# Patient Record
Sex: Male | Born: 1960 | Race: White | Hispanic: No | Marital: Married | Smoking: Current every day smoker
Health system: Southern US, Community
[De-identification: ages and names within clinical notes are randomized; demographics above are authoritative.]

## PROBLEM LIST (undated history)

## (undated) DIAGNOSIS — J449 Chronic obstructive pulmonary disease, unspecified: Secondary | ICD-10-CM

## (undated) DIAGNOSIS — K219 Gastro-esophageal reflux disease without esophagitis: Secondary | ICD-10-CM

## (undated) DIAGNOSIS — E785 Hyperlipidemia, unspecified: Secondary | ICD-10-CM

## (undated) DIAGNOSIS — A630 Anogenital (venereal) warts: Secondary | ICD-10-CM

## (undated) HISTORY — PX: INGUINAL HERNIA REPAIR: SUR1180

---
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
## 1998-10-17 ENCOUNTER — Encounter: Payer: Self-pay | Admitting: Internal Medicine

## 1998-10-17 ENCOUNTER — Inpatient Hospital Stay (HOSPITAL_COMMUNITY): Admission: EM | Admit: 1998-10-17 | Discharge: 1998-10-18 | Payer: Self-pay | Admitting: Emergency Medicine

## 1998-10-18 ENCOUNTER — Encounter: Payer: Self-pay | Admitting: Internal Medicine

## 2005-03-15 ENCOUNTER — Emergency Department (HOSPITAL_COMMUNITY): Admission: EM | Admit: 2005-03-15 | Discharge: 2005-03-15 | Payer: Self-pay | Admitting: Family Medicine

## 2005-05-18 ENCOUNTER — Emergency Department (HOSPITAL_COMMUNITY): Admission: EM | Admit: 2005-05-18 | Discharge: 2005-05-18 | Payer: Self-pay | Admitting: *Deleted

## 2006-09-05 ENCOUNTER — Observation Stay (HOSPITAL_COMMUNITY): Admission: EM | Admit: 2006-09-05 | Discharge: 2006-09-05 | Payer: Self-pay | Admitting: Emergency Medicine

## 2010-01-08 ENCOUNTER — Encounter: Admission: RE | Admit: 2010-01-08 | Discharge: 2010-01-08 | Payer: Self-pay | Admitting: Family Medicine

## 2010-07-16 NOTE — Consult Note (Signed)
NAMEJAYDEE, Wesley [name] NO.:  [id number]   MEDICAL RECORD NO.:  [id number]          PATIENT TYPE:  INP   LOCATION:  2911                         FACILITY:  [facility]   PHYSICIAN:  Wesley [name], M.D. DATE OF BIRTH:  Sep 13, 1960   DATE OF CONSULTATION:  09/05/2006  DATE OF DISCHARGE:                                 CONSULTATION   HISTORY:  This is a 50 year old Caucasian gentleman who was admitted  early in the morning on 09/05/2006 with chest pain.  He does not have  any history of known prior coronary artery disease.  He does have risk  factors of hyperlipidemia and tobacco abuse.  He had had some  intermittent chest discomfort down his arms associated with tightness  which was associated with some diaphoresis on the night of admission.  There was some radiation to the left arm.   The patient has a past history of elevated cholesterol.  He is on statin  therapy and is followed by Dr. Vianne [name].  He is on Zocor.  Last  evening he was on a July 4 party.  He drank about 5 beers last evening  prior to coming to the emergency room and his blood alcohol level on  arrival was significantly elevated at 148.  His D-dimer was normal at  0.22.  Chest x-ray was normal.  Troponin levels were normal x2, and CK  MBs were normal x2.  His electrocardiograms have been normal.   SOCIAL HISTORY:  Reveals that he is single but has a steady girlfriend.  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  He smokes a pack of cigarettes a day.   FAMILY HISTORY:  Reveals that his father has had valve surgery and his  mother is living and has no heart problems.  Several of his paternal  uncles have had coronary disease.   ALLERGIES:  The patient has no known drug allergies.   REVIEW OF SYSTEMS:  His review of systems is otherwise negative.   PHYSICAL EXAMINATION:  On exam his weight is 205.  The blood pressure is  118/62, pulse is 80.  Respirations normal, O2  sat 93%.  A well-  nourished, well-developed gentleman in no distress.  The skin is clear.  HEENT:  Negative.  LUNGS are clear to percussion and auscultation.  HEART:  Reveals no murmur, gallop, rub or click.  ABDOMEN is soft and nontender.  EXTREMITIES:  Show no phlebitis or edema.   EKG is normal.  He was at sinus bradycardia at 57 and no ischemic  changes.  Cardiac enzymes are negative.   IMPRESSION:  1. Atypical chest pain, but several risk factors for coronary disease,      myocardial infarction ruled out  2. Excessive alcohol ingestion last evening.  3. Tobacco abuse.   DISPOSITION:  At this point I believe that the patient can be safely  discharged home and we will plan to do a follow-up treadmill Cardiolite  stress test on him in the office next week.  He will call our office  Monday  to set that up with my nurse.  Instructions have been given.   DISCHARGE MEDICATIONS:  1. Ecotrin 325 mg daily  2. Zocor 20 mg daily  3. Protonix 40 mg daily  4. Toprol XL 25 mg once a day.  5. He will be given Nitrostat 150 units for p.r.n. use until we can do      his stress test           ______________________________  Wesley [name], M.D.     TB/MEDQ  D:  09/05/2006  T:  09/06/2006  Job:  [id number]   cc:   Wesley L. Ladona Ridgel, MD

## 2010-07-16 NOTE — H&P (Signed)
Wesley [name], Wesley [name]                 ACCOUNT NO.:  [id number]   MEDICAL RECORD NO.:  [id number]          PATIENT TYPE:  INP   LOCATION:  1828                         FACILITY:  [facility]   PHYSICIAN:  Melissa L. Ladona Ridgel, MD  DATE OF BIRTH:  07-04-1960   DATE OF ADMISSION:  09/04/2006  DATE OF DISCHARGE:                              HISTORY & PHYSICAL   CHIEF COMPLAINT:  Chest pain.   PRIMARY CARE PHYSICIAN:  Dr. Vianne Bulls   HISTORY OF PRESENT ILLNESS:  The patient is a 50 year old white male old white male  with a past medical history of hypercholesterolemia who states that he  started to have intermittent chest pain for the last two to three weeks.  Describes this as a tightness and really minimalizes the pain portion of  this.  He states that it is continuous, sometimes up and down his arm.  He says he had this tonight with profuse onset of sweating.  He is just  generally not feeling well.  He states that nothing makes it better or  worse, and the location varies.  There is some element of radiation down  the arm.  He denies any diaphoresis, shortness of breath, nausea or  vomiting really with this illness.   REVIEW OF SYSTEMS:  No appetite changes.  No weight loss or weight gain.  No muscle aches, genitourinary symptoms, or other GI symptoms.  All else  are negative.   PAST MEDICAL HISTORY:  As stated, hypercholesterolemia.   PAST SURGICAL HISTORY:  None.   SOCIAL HISTORY:  He smokes a pack a day for the last 22 years.  He  drinks a couple of wine glasses a night.  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  The patient has multiple family members who he has  requested to be at the bedside and, therefore, I have not questioned him  regarding illicit drug use.   FAMILY HISTORY:  Dad is living, with a history of coronary artery  disease at the age of 20.  Mom is living with no medical illness, and he  has three sisters who are healthy.   ALLERGIES:  None.   MEDICATIONS:  Zocor questionably  20 mg a day, and a multivitamin.   PHYSICAL EXAMINATION:  Vital Signs:  Temperature is 98.7, blood pressure  118/62, pulse 87, respirations 18, saturation 93%.  Generally, this is a  well-developed, well-nourished white male in no acute distress.  He is  cognitively slow but appropriate.  He is normocephalic, atraumatic.  Pupils were equal, round, and reactive to light.  Extraocular movements  intact.  Mucous membranes are dry and neck is supple.  There is no JVD,  no carotid bruits.  Chest is decreased but clear.  Cardiovascular is  regular rate and rhythm.  Positive S1, S2.  No S3, S4.  No murmurs,  rubs, or gallops are noted.  Abdomen is soft, nontender, nondistended  with positive bowel sounds.  Extremities show no clubbing, cyanosis, or  edema.  Neurologically, he is mildly cognitively impaired with 5/5.  DTRs are 2+.  Toes are downgoing.  LABORATORY DATA AND RADIOLOGY:  Pertinent laboratories reveal cardiac  markers negative times two in the Emergency Room.  D-dimer is less than  22.  Sodium is 140, potassium 3.8, chloride 110, CO2 is 22, BUN is 15,  creatinine 0.8, and glucose is 107.  His white count is 5.6, hemoglobin  is 14.6, hematocrit 42.1 with platelets of 262.  His monocytes are  elevated but no neutrophilia.   Chest x-ray is negative.   ASSESSMENT AND PLAN:  The patient is a 50 year white male old white male with  atypical chest pain relieved in the Emergency Room per the staff with  nitroglycerin, although the patient states no exacerbating or  alleviating factors.  His risk factors for coronary artery disease are  hyperlipidemia, tobacco abuse, and a father with early coronary artery  disease at the age of 65.   (1) Cardiovascular:  Atypical chest pain with a history of  hyperlipidemia and tobacco abuse.  Will admit him to the Step-Down Unit  at this time.  I agree with the nitroglycerin and heparin as tolerated.  Will do serial cardiac markers.  I will check a urine drug  screen and  ethanol level.  Will recheck an EKG in the a.m.  He will be continued on  aspirin and, if tolerated with his blood pressure and heart rate, will  give a low-dose beta-blocker.  Will ask for Cardiac evaluation in the  a.m.  (2) Pulmonary:  He has no dyspnea.  His d-dimer is within normal  limits.  I do not believe there is a need for CT scanning of the chest  at this time.  We will encourage tobacco cessation.  (3) GI:  Will start  a month of Protonix.  (4) GU:  Will check urine drug screen.  (5)  Endocrine:  Will check a TSH.  (6) DVT prophylaxis will be with heparin.      Melissa L. Ladona Ridgel, MD  Electronically Signed     MLT/MEDQ  D:  09/05/2006  T:  09/05/2006  Job:  [id number]   cc:   C. Duane Lope, M.D.

## 2010-07-19 NOTE — Discharge Summary (Signed)
NAMESOPHIE, [name]                 ACCOUNT NO.:  [id number]   MEDICAL RECORD NO.:  [id number]          PATIENT TYPE:  INP   LOCATION:  2911                         FACILITY:  [facility]   PHYSICIAN:  Barnetta Chapel, MDDATE OF BIRTH:  10-04-1960   DATE OF ADMISSION:  09/04/2006  DATE OF DISCHARGE:  09/05/2006                               DISCHARGE SUMMARY   PRIMARY CARE PHYSICIAN:  C. Duane Lope, M.D.   DISCHARGE DIAGNOSES:  1. Chest pain, atypical.  2. Tobacco use disorder.   CONSULTATIONS:  Cardiology consult.   DISCHARGE MEDICATIONS:  1. Zocor 20 mg p.o. once daily.  2. Multivitamin.  3. Protonix 40 mg t.i.d.   BRIEF HISTORY/HOSPITAL COURSE:  Please refer to the H&P done earlier on  in the day (September 04, 2006).  Patient is a 50 year old male with a past  medical history significant for dyslipidemia.  The patient presented  with chest pain that has been going on for three weeks.   The patient was admitted to the telemetry floor for observation.  The  cardiac enzymes were all negative.  The chest pain is thought to be  atypical.  A cardiology consult was called.  The patient was seen by the  cardiologist, and an outpatient cardiac stress test was planned.  Cardiology also advised Toprol, some aspirin, and nitro.   DISCHARGE PLAN:  1. Discharge the patient home today.  2. Follow up with the cardiologist for outpatient cardiac stress test.  3. Cardiac diet.  4. Activity as tolerated.      Barnetta Chapel, MD  Electronically Signed     SIO/MEDQ  D:  11/03/2006  T:  11/03/2006  Job:  [id number]   cc:   C. Duane Lope, M.D.

## 2010-12-17 LAB — CBC
HCT: 41.3
HCT: 42.1
Hemoglobin: 14.2
Hemoglobin: 14.6
MCHC: 34.5
MCHC: 34.8
MCV: 91.3
MCV: 93.3
Platelets: 257
Platelets: 262
RBC: 4.43
RBC: 4.61
RDW: 12.2
RDW: 12.3
WBC: 11.2 — ABNORMAL HIGH
WBC: 11.6 — ABNORMAL HIGH

## 2010-12-17 LAB — I-STAT 8, (EC8 V) (CONVERTED LAB)
Acid-base deficit: 4 — ABNORMAL HIGH
BUN: 15
Bicarbonate: 20.7
Chloride: 110
Glucose, Bld: 107 — ABNORMAL HIGH
HCT: 44
Hemoglobin: 15
Operator id: 189501
Potassium: 3.8
Sodium: 140
TCO2: 22
pCO2, Ven: 34.8 — ABNORMAL LOW
pH, Ven: 7.383 — ABNORMAL HIGH

## 2010-12-17 LAB — CK TOTAL AND CKMB (NOT AT ARMC)
CK, MB: 2.4
Relative Index: 1.2
Total CK: 193

## 2010-12-17 LAB — D-DIMER, QUANTITATIVE (NOT AT ARMC): D-Dimer, Quant: <0.22

## 2010-12-17 LAB — POCT CARDIAC MARKERS
CKMB, poc: 1.4
CKMB, poc: 1.6
Myoglobin, poc: 72.4
Myoglobin, poc: 85.8
Operator id: 189501
Operator id: 189501
Troponin i, poc: <0.05
Troponin i, poc: <0.05

## 2010-12-17 LAB — RAPID URINE DRUG SCREEN, HOSP PERFORMED
Amphetamines: NOT DETECTED
Barbiturates: NOT DETECTED
Benzodiazepines: NOT DETECTED
Cocaine: NOT DETECTED
Opiates: NOT DETECTED
Tetrahydrocannabinol: NOT DETECTED

## 2010-12-17 LAB — BASIC METABOLIC PANEL
BUN: 13
CO2: 22
Calcium: 8.3 — ABNORMAL LOW
Chloride: 107
Creatinine, Ser: 0.83
GFR calc Af Amer: >60
GFR calc non Af Amer: >60
Glucose, Bld: 95
Potassium: 3.9
Sodium: 140

## 2010-12-17 LAB — DIFFERENTIAL
Basophils Absolute: 0.1
Basophils Relative: 1
Eosinophils Absolute: 0.3
Eosinophils Relative: 3
Lymphocytes Relative: 38
Lymphs Abs: 4.4 — ABNORMAL HIGH
Monocytes Absolute: 0.8 — ABNORMAL HIGH
Monocytes Relative: 7
Neutro Abs: 5.9
Neutrophils Relative %: 51

## 2010-12-17 LAB — CARDIAC PANEL(CRET KIN+CKTOT+MB+TROPI)
CK, MB: 2
Relative Index: 1.3
Total CK: 155
Troponin I: 0.01

## 2010-12-17 LAB — POCT I-STAT CREATININE
Creatinine, Ser: 0.8
Operator id: 189501

## 2010-12-17 LAB — TSH: TSH: 2.433

## 2010-12-17 LAB — TROPONIN I: Troponin I: 0.01

## 2010-12-17 LAB — ETHANOL: Alcohol, Ethyl (B): 148 — ABNORMAL HIGH

## 2010-12-17 LAB — HEPARIN LEVEL (UNFRACTIONATED): Heparin Unfractionated: 0.66

## 2011-06-06 ENCOUNTER — Encounter (INDEPENDENT_AMBULATORY_CARE_PROVIDER_SITE_OTHER): Payer: Self-pay

## 2011-06-16 ENCOUNTER — Telehealth (INDEPENDENT_AMBULATORY_CARE_PROVIDER_SITE_OTHER): Payer: Self-pay | Admitting: Surgery

## 2011-06-16 ENCOUNTER — Encounter (INDEPENDENT_AMBULATORY_CARE_PROVIDER_SITE_OTHER): Payer: Self-pay | Admitting: Surgery

## 2011-06-16 ENCOUNTER — Ambulatory Visit (INDEPENDENT_AMBULATORY_CARE_PROVIDER_SITE_OTHER): Payer: BC Managed Care – PPO | Admitting: Surgery

## 2011-06-16 VITALS — BP 148/88 | HR 82 | Temp 98.2°F | Ht 68.0 in | Wt 199.4 lb

## 2011-06-16 DIAGNOSIS — A630 Anogenital (venereal) warts: Secondary | ICD-10-CM

## 2011-06-16 NOTE — Patient Instructions (Signed)
Genital Warts Genital warts are a sexually transmitted infection. They may appear as small bumps on the tissues of the genital area. CAUSES  Genital warts are caused by a virus called human papillomavirus (HPV). HPV is the most common sexually transmitted disease (STD) and infection of the sex organs. This infection is spread by having unprotected sex with an infected person. It can be spread by vaginal, anal, and oral sex. Many people do not know they are infected. They may be infected for years without problems. However, even if they do not have problems, they can unknowingly pass the infection to their sexual partners. SYMPTOMS   Itching and irritation in the genital area.   Warts that bleed.   Painful sexual intercourse.  DIAGNOSIS  Warts are usually recognized with the naked eye on the vagina, vulva, perineum, anus, and rectum. Certain tests can also diagnose genital warts, such as:  A Pap test.   A tissue sample (biopsy) exam.   Colposcopy. A magnifying tool is used to examine the vagina and cervix. The HPV cells will change color when certain solutions are used.  TREATMENT  Warts can be removed by:  Applying certain chemicals, such as cantharidin or podophyllin.   Liquid nitrogen freezing (cryotherapy).   Immunotherapy with candida or trichophyton injections.   Laser treatment.   Burning with an electrified probe (electrocautery).   Interferon injections.   Surgery.  PREVENTION  HPV vaccination can help prevent HPV infections that cause genital warts and that cause cancer of the cervix. It is recommended that the vaccination be given to people between the ages 9 to 26 years old. The vaccine might not work as well or might not work at all if you already have HPV. It should not be given to pregnant women. HOME CARE INSTRUCTIONS   It is important to follow your caregiver's instructions. The warts will not go away without treatment. Repeat treatments are often needed to get  rid of warts. Even after it appears that the warts are gone, the normal tissue underneath often remains infected.   Do not try to treat genital warts with medicine used to treat hand warts. This type of medicine is strong and can burn the skin in the genital area, causing more damage.   Tell your past and current sexual partner(s) that you have genital warts. They may be infected also and need treatment.   Avoid sexual contact while being treated.   Do not touch or scratch the warts. The infection may spread to other parts of your body.   Women with genital warts should have a cervical cancer check (Pap test) at least once a year. This type of cancer is slow-growing and can be cured if found early. Chances of developing cervical cancer are increased with HPV.   Inform your obstetrician about your warts in the event of pregnancy. This virus can be passed to the baby's respiratory tract. Discuss this with your caregiver.   Use a condom during sexual intercourse. Following treatment, the use of condoms will help prevent reinfection.   Ask your caregiver about using over-the-counter anti-itch creams.  SEEK MEDICAL CARE IF:   Your treated skin becomes red, swollen, or painful.   You have a fever.   You feel generally ill.   You feel little lumps in and around your genital area.   You are bleeding or have painful sexual intercourse.  MAKE SURE YOU:   Understand these instructions.   Will watch your condition.   Will   get help right away if you are not doing well or get worse.  Document Released: 02/15/2000 Document Revised: 02/06/2011 Document Reviewed: 08/26/2010 [employer] Patient Information 2012 [employer]. 
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
## 2011-06-17 NOTE — Telephone Encounter (Signed)
IN OFFICE ALL DAY ON [address]  THIS NEEDS TO BE SENT TO SCHEDULERS TO DISCUSS WITH PATIENT TIME AND SETTING SOMETHING UP.

## 2011-06-19 ENCOUNTER — Other Ambulatory Visit (INDEPENDENT_AMBULATORY_CARE_PROVIDER_SITE_OTHER): Payer: Self-pay | Admitting: Surgery

## 2011-07-02 DIAGNOSIS — A630 Anogenital (venereal) warts: Secondary | ICD-10-CM

## 2011-07-02 HISTORY — DX: Anogenital (venereal) warts: A63.0

## 2011-07-11 ENCOUNTER — Encounter (HOSPITAL_BASED_OUTPATIENT_CLINIC_OR_DEPARTMENT_OTHER): Payer: Self-pay | Admitting: *Deleted

## 2011-07-11 NOTE — Pre-Procedure Instructions (Signed)
To come for CMET, CBC, diff, CXR

## 2011-07-15 ENCOUNTER — Encounter (HOSPITAL_BASED_OUTPATIENT_CLINIC_OR_DEPARTMENT_OTHER)
Admission: RE | Admit: 2011-07-15 | Discharge: 2011-07-15 | Disposition: A | Discharge status: Home / Self Care | Payer: BC Managed Care – PPO | Source: Ambulatory Visit | Attending: Surgery | Admitting: Surgery

## 2011-07-15 ENCOUNTER — Ambulatory Visit
Admission: RE | Admit: 2011-07-15 | Discharge: 2011-07-15 | Disposition: A | Discharge status: Home / Self Care | Payer: BC Managed Care – PPO | Source: Ambulatory Visit | Attending: Surgery | Admitting: Surgery

## 2011-07-15 LAB — DIFFERENTIAL
Basophils Absolute: 0.1 10*3/uL (ref 0.0–0.1)
Basophils Relative: 1 % (ref 0–1)
Eosinophils Absolute: 0.2 10*3/uL (ref 0.0–0.7)
Eosinophils Relative: 3 % (ref 0–5)
Lymphocytes Relative: 30 % (ref 12–46)
Lymphs Abs: 2.4 10*3/uL (ref 0.7–4.0)
Monocytes Absolute: 0.8 10*3/uL (ref 0.1–1.0)
Monocytes Relative: 9 % (ref 3–12)
Neutro Abs: 4.8 10*3/uL (ref 1.7–7.7)
Neutrophils Relative %: 58 % (ref 43–77)

## 2011-07-15 LAB — COMPREHENSIVE METABOLIC PANEL
ALT: 36 U/L (ref 0–53)
AST: 30 U/L (ref 0–37)
Albumin: 4 g/dL (ref 3.5–5.2)
Alkaline Phosphatase: 97 U/L (ref 39–117)
BUN: 13 mg/dL (ref 6–23)
CO2: 24 mEq/L (ref 19–32)
Calcium: 9.3 mg/dL (ref 8.4–10.5)
Chloride: 103 mEq/L (ref 96–112)
Creatinine, Ser: 0.78 mg/dL (ref 0.50–1.35)
GFR calc Af Amer: >90 mL/min (ref >90)
GFR calc non Af Amer: >90 mL/min (ref >90)
Glucose, Bld: 92 mg/dL (ref 70–99)
Potassium: 4.9 mEq/L (ref 3.5–5.1)
Sodium: 138 mEq/L (ref 135–145)
Total Bilirubin: 0.8 mg/dL (ref 0.3–1.2)
Total Protein: 7.3 g/dL (ref 6.0–8.3)

## 2011-07-15 LAB — CBC
HCT: 43.8 % (ref 39.0–52.0)
Hemoglobin: 15.9 g/dL (ref 13.0–17.0)
MCH: 32.5 pg (ref 26.0–34.0)
MCHC: 36.3 g/dL — ABNORMAL HIGH (ref 30.0–36.0)
MCV: 89.6 fL (ref 78.0–100.0)
Platelets: 238 10*3/uL (ref 150–400)
RBC: 4.89 MIL/uL (ref 4.22–5.81)
RDW: 12.1 % (ref 11.5–15.5)
WBC: 8.2 10*3/uL (ref 4.0–10.5)

## 2011-07-16 ENCOUNTER — Encounter (HOSPITAL_BASED_OUTPATIENT_CLINIC_OR_DEPARTMENT_OTHER): Payer: Self-pay | Admitting: *Deleted

## 2011-07-16 ENCOUNTER — Ambulatory Visit (HOSPITAL_BASED_OUTPATIENT_CLINIC_OR_DEPARTMENT_OTHER)
Admission: RE | Admit: 2011-07-16 | Discharge: 2011-07-16 | Disposition: A | Discharge status: Home / Self Care | Payer: BC Managed Care – PPO | Source: Ambulatory Visit | Attending: Surgery | Admitting: Surgery

## 2011-07-16 ENCOUNTER — Encounter (HOSPITAL_BASED_OUTPATIENT_CLINIC_OR_DEPARTMENT_OTHER)
Admission: RE | Disposition: A | Discharge status: Home / Self Care | Payer: Self-pay | Source: Ambulatory Visit | Attending: Surgery

## 2011-07-16 ENCOUNTER — Encounter (HOSPITAL_BASED_OUTPATIENT_CLINIC_OR_DEPARTMENT_OTHER): Payer: Self-pay | Admitting: Certified Registered Nurse Anesthetist

## 2011-07-16 ENCOUNTER — Ambulatory Visit (HOSPITAL_BASED_OUTPATIENT_CLINIC_OR_DEPARTMENT_OTHER): Payer: BC Managed Care – PPO | Admitting: Certified Registered Nurse Anesthetist

## 2011-07-16 DIAGNOSIS — F17200 Nicotine dependence, unspecified, uncomplicated: Secondary | ICD-10-CM | POA: Insufficient documentation

## 2011-07-16 DIAGNOSIS — A630 Anogenital (venereal) warts: Secondary | ICD-10-CM

## 2011-07-16 DIAGNOSIS — Z9889 Other specified postprocedural states: Secondary | ICD-10-CM

## 2011-07-16 DIAGNOSIS — K219 Gastro-esophageal reflux disease without esophagitis: Secondary | ICD-10-CM | POA: Insufficient documentation

## 2011-07-16 DIAGNOSIS — R51 Headache: Secondary | ICD-10-CM | POA: Insufficient documentation

## 2011-07-16 HISTORY — PX: EXAMINATION UNDER ANESTHESIA: SHX1540

## 2011-07-16 HISTORY — DX: Gastro-esophageal reflux disease without esophagitis: K21.9

## 2011-07-16 HISTORY — PX: WART FULGURATION: SHX5245

## 2011-07-16 HISTORY — DX: Anogenital (venereal) warts: A63.0

## 2011-07-16 SURGERY — FULGURATION, WART, ANUS
Anesthesia: General | Site: Anus | Wound class: Clean Contaminated

## 2011-07-16 MED ORDER — FLEET ENEMA 7-19 GM/118ML RE ENEM: 1.0000 | ENEMA | Freq: Once | RECTAL | Status: DC

## 2011-07-16 MED ORDER — PROPOFOL 10 MG/ML IV EMUL
INTRAVENOUS | Status: DC | PRN
  Administered 2011-07-16: 150 mg via INTRAVENOUS

## 2011-07-16 MED ORDER — DROPERIDOL 2.5 MG/ML IJ SOLN
INTRAMUSCULAR | Status: DC | PRN
  Administered 2011-07-16: 0.625 mg via INTRAVENOUS

## 2011-07-16 MED ORDER — CEFAZOLIN SODIUM-DEXTROSE 2-3 GM-% IV SOLR: 2.0000 g | INTRAVENOUS | Status: DC

## 2011-07-16 MED ORDER — LACTATED RINGERS IV SOLN
INTRAVENOUS | Status: DC
  Administered 2011-07-16 (×2): via INTRAVENOUS

## 2011-07-16 MED ORDER — DEXAMETHASONE SODIUM PHOSPHATE 4 MG/ML IJ SOLN
INTRAMUSCULAR | Status: DC | PRN
  Administered 2011-07-16: 10 mg via INTRAVENOUS

## 2011-07-16 MED ORDER — HYDROMORPHONE HCL PF 1 MG/ML IJ SOLN: 0.2500 mg | INTRAMUSCULAR | Status: DC | PRN

## 2011-07-16 MED ORDER — MIDAZOLAM HCL 5 MG/5ML IJ SOLN
INTRAMUSCULAR | Status: DC | PRN
  Administered 2011-07-16: 2 mg via INTRAVENOUS

## 2011-07-16 MED ORDER — LIDOCAINE HCL (CARDIAC) 20 MG/ML IV SOLN
INTRAVENOUS | Status: DC | PRN
  Administered 2011-07-16: 60 mg via INTRAVENOUS

## 2011-07-16 MED ORDER — ONDANSETRON HCL 4 MG/2ML IJ SOLN
INTRAMUSCULAR | Status: DC | PRN
  Administered 2011-07-16: 4 mg via INTRAVENOUS

## 2011-07-16 MED ORDER — CEFAZOLIN SODIUM-DEXTROSE 2-3 GM-% IV SOLR
2.0000 g | INTRAVENOUS | Status: AC
  Administered 2011-07-16: 2 g via INTRAVENOUS

## 2011-07-16 MED ORDER — MIDAZOLAM HCL 2 MG/2ML IJ SOLN: 0.5000 mg | INTRAMUSCULAR | Status: DC | PRN

## 2011-07-16 MED ORDER — ACETAMINOPHEN 10 MG/ML IV SOLN
1000.0000 mg | Freq: Once | INTRAVENOUS | Status: AC
  Administered 2011-07-16: 1000 mg via INTRAVENOUS

## 2011-07-16 MED ORDER — METOCLOPRAMIDE HCL 5 MG/ML IJ SOLN: 10.0000 mg | Freq: Once | INTRAMUSCULAR | Status: DC | PRN

## 2011-07-16 MED ORDER — BUPIVACAINE-EPINEPHRINE 0.25% -1:200000 IJ SOLN
INTRAMUSCULAR | Status: DC | PRN
  Administered 2011-07-16: 30 mL

## 2011-07-16 MED ORDER — DIBUCAINE 1 % RE OINT
TOPICAL_OINTMENT | RECTAL | Status: DC | PRN
  Administered 2011-07-16: 1046 via RECTAL

## 2011-07-16 MED ORDER — FENTANYL CITRATE 0.05 MG/ML IJ SOLN
INTRAMUSCULAR | Status: DC | PRN
  Administered 2011-07-16: 50 ug via INTRAVENOUS

## 2011-07-16 SURGICAL SUPPLY — 38 items
BLADE SURG 15 STRL LF DISP TIS (BLADE) ×1 IMPLANT
BLADE SURG 15 STRL SS (BLADE) ×2
BRIEF STRETCH FOR OB PAD LRG (UNDERPADS AND DIAPERS) IMPLANT
CANISTER SUCTION 1200CC (MISCELLANEOUS) ×2 IMPLANT
CLOTH BEACON ORANGE TIMEOUT ST (SAFETY) ×2 IMPLANT
COVER MAYO STAND STRL (DRAPES) ×1 IMPLANT
DECANTER SPIKE VIAL GLASS SM (MISCELLANEOUS) ×2 IMPLANT
DRAPE UTILITY XL STRL (DRAPES) ×2 IMPLANT
DRSG PAD ABDOMINAL 8X10 ST (GAUZE/BANDAGES/DRESSINGS) ×2 IMPLANT
ELECT REM PT RETURN 9FT ADLT (ELECTROSURGICAL) ×2
ELECTRODE REM PT RTRN 9FT ADLT (ELECTROSURGICAL) IMPLANT
FILTER 7/8 IN (FILTER) ×1 IMPLANT
GAUZE SPONGE 4X4 12PLY STRL LF (GAUZE/BANDAGES/DRESSINGS) ×2 IMPLANT
GLOVE BIO SURGEON STRL SZ 6.5 (GLOVE) ×1 IMPLANT
GLOVE BIOGEL PI IND STRL 8 (GLOVE) ×1 IMPLANT
GLOVE BIOGEL PI INDICATOR 8 (GLOVE) ×1
GLOVE ECLIPSE 8.0 STRL XLNG CF (GLOVE) ×2 IMPLANT
GLOVE INDICATOR 7.0 STRL GRN (GLOVE) ×1 IMPLANT
GOWN PREVENTION PLUS XLARGE (GOWN DISPOSABLE) ×2 IMPLANT
NDL HYPO 25X1 1.5 SAFETY (NEEDLE) ×1 IMPLANT
NEEDLE HYPO 25X1 1.5 SAFETY (NEEDLE) ×2 IMPLANT
PACK BASIN DAY SURGERY FS (CUSTOM PROCEDURE TRAY) ×2 IMPLANT
PACK LITHOTOMY IV (CUSTOM PROCEDURE TRAY) ×2 IMPLANT
PENCIL BUTTON HOLSTER BLD 10FT (ELECTRODE) ×1 IMPLANT
SHEET MEDIUM DRAPE 40X70 STRL (DRAPES) IMPLANT
SPONGE SURGIFOAM ABS GEL 12-7 (HEMOSTASIS) IMPLANT
SURGILUBE 2OZ TUBE FLIPTOP (MISCELLANEOUS) ×2 IMPLANT
SUT CHROMIC 3 0 SH 27 (SUTURE) IMPLANT
SYR CONTROL 10ML LL (SYRINGE) ×2 IMPLANT
TOWEL OR 17X24 6PK STRL BLUE (TOWEL DISPOSABLE) ×3 IMPLANT
TOWEL OR NON WOVEN STRL DISP B (DISPOSABLE) ×2 IMPLANT
TRAY DSU PREP LF (CUSTOM PROCEDURE TRAY) ×2 IMPLANT
TRAY PROCTOSCOPIC FIBER OPTIC (SET/KITS/TRAYS/PACK) IMPLANT
TUBE CONNECTING 20X1/4 (TUBING) ×2 IMPLANT
TUBING STERILE (MISCELLANEOUS) ×1 IMPLANT
UNDERPAD 30X30 INCONTINENT (UNDERPADS AND DIAPERS) ×2 IMPLANT
WATER STERILE IRR 1000ML POUR (IV SOLUTION) IMPLANT
YANKAUER SUCT BULB TIP NO VENT (SUCTIONS) ×2 IMPLANT

## 2011-07-16 NOTE — Transfer of Care (Signed)
Immediate Anesthesia Transfer of Care Note  Patient: Wesley [name].  Procedure(s) Performed: Procedure(s) (LRB): FULGURATION ANAL WART (N/A) EXAM UNDER ANESTHESIA (N/A)  Patient Location: PACU  Anesthesia Type: General  Level of Consciousness: awake, alert , oriented and patient cooperative  Airway & Oxygen Therapy: Patient Spontanous Breathing and Patient connected to face mask oxygen  Post-op Assessment: Report given to PACU RN and Post -op Vital signs reviewed and stable  Post vital signs: Reviewed and stable  Complications: No apparent anesthesia complications
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
## 2011-07-16 NOTE — Anesthesia Preprocedure Evaluation (Signed)
Anesthesia Evaluation  Patient identified by MRN, date of birth, ID band Patient awake    Reviewed: Allergy & Precautions, H&P , NPO status , Patient's Chart, lab work & pertinent test results, reviewed documented beta blocker date and time   Airway Mallampati: II TM Distance: >3 FB Neck ROM: full    Dental   Pulmonary Current Smoker,          Cardiovascular negative cardio ROS      Neuro/Psych  Headaches, negative psych ROS   GI/Hepatic negative GI ROS, Neg liver ROS, GERD-  Medicated and Controlled,  Endo/Other  negative endocrine ROS  Renal/GU negative Renal ROS  negative genitourinary   Musculoskeletal   Abdominal   Peds  Hematology negative hematology ROS (+)   Anesthesia Other Findings See surgeon's H&P   Reproductive/Obstetrics negative OB ROS                           Anesthesia Physical Anesthesia Plan  ASA: II  Anesthesia Plan: General   Post-op Pain Management:    Induction: Intravenous  Airway Management Planned: LMA  Additional Equipment:   Intra-op Plan:   Post-operative Plan: Extubation in OR  Informed Consent: I have reviewed the patients History and Physical, chart, labs and discussed the procedure including the risks, benefits and alternatives for the proposed anesthesia with the patient or authorized representative who has indicated his/her understanding and acceptance.   Dental Advisory Given  Plan Discussed with: CRNA and Surgeon  Anesthesia Plan Comments:         Anesthesia Quick Evaluation

## 2011-07-16 NOTE — Discharge Instructions (Signed)
[facility] _______[facility] ° °RECTAL SURGERY POST OP INSTRUCTIONS: POST OP INSTRUCTIONS ° °Always review your discharge instruction sheet given to you by the facility where your surgery was performed. °IF YOU HAVE DISABILITY OR FAMILY LEAVE FORMS, YOU MUST BRING THEM TO THE OFFICE FOR PROCESSING.   °DO NOT GIVE THEM TO YOUR DOCTOR. ° °1. A  prescription for pain medication may be given to you upon discharge.  Take your pain medication as prescribed, if needed.  If narcotic pain medicine is not needed, then you may take acetaminophen (Tylenol) or ibuprofen (Advil) as needed. °2. Take your usually prescribed medications unless otherwise directed. °3. If you need a refill on your pain medication, please contact your pharmacy.  They will contact our office to request authorization. Prescriptions will not be filled after 5 pm or on week-ends. °4. You should follow a light diet the first 48 hours after arrival home, such as soup and crackers, etc.  Be sure to include lots of fluids daily.  Resume your normal diet 2-3 days after surgery.. °5. Most patients will experience some swelling and discomfort in the rectal area. Ice packs, reclining and warm tub soaks will help.  Swelling and discomfort can take several days to resolve.  °6. It is common to experience some constipation if taking pain medication after surgery.  Increasing fluid intake and taking a stool softener (such as Colace) will usually help or prevent this problem from occurring.  A mild laxative (Milk of Magnesia or Miralax) should be taken according to package directions if there are no bowel movements after 48 hours. °7. Unless discharge instructions indicate otherwise, leave your bandage dry and in place for 24 hours, or remove the bandage if you have a bowel movement. You may notice a small amount of bleeding with bowel movements for the first few days. You may have some packing in the rectum which will come out over the first day or two. You  will need to wear an absorbent pad or soft cotton gauze in your underwear until the drainage stops.it. °8. ACTIVITIES:  You may resume regular (light) daily activities beginning the next day--such as daily self-care, walking, climbing stairs--gradually increasing activities as tolerated.  You may have sexual intercourse when it is comfortable.  Refrain from any heavy lifting or straining until approved by your doctor. °a. You may drive when you are no longer taking prescription pain medication, you can comfortably wear a seatbelt, and you can safely maneuver your car and apply brakes. °b. RETURN TO WORK: : ____________________ °c.  °9. You should see your doctor in the office for a follow-up appointment approximately 2-3 weeks after your surgery.  Make sure that you call for this appointment within a day or two after you arrive home to insure a convenient appointment time. °10. OTHER INSTRUCTIONS:  __________________________________________________________________________________________________________________________________________________________________________________________  °WHEN TO CALL YOUR DOCTOR: °1. Fever over 101.0 °2. Inability to urinate °3. Nausea and/or vomiting °4. Extreme swelling or bruising °5. Continued bleeding from rectum. °6. Increased pain, redness, or drainage from the incision °7. Constipation ° °The clinic staff is available to answer your questions during regular business hours.  Please don’t hesitate to call and ask to speak to one of the nurses for clinical concerns.  If you have a medical emergency, go to the nearest emergency room or call 911.  A surgeon from [facility] is always on call at the hospital ° ° °[address], [address], [place], [place]  [address] ? °   [address] [address], [place], [place]   [address] °[phone] ? [phone] ? FAX [phone] °Web site: [email or web address] ° °

## 2011-07-16 NOTE — Anesthesia Procedure Notes (Signed)
Procedure Name: LMA Insertion Date/Time: 07/16/2011 10:19 AM Performed by: Beverlyann Broxterman D Pre-anesthesia Checklist: Patient identified, Emergency Drugs available, Suction available and Patient being monitored Patient Re-evaluated:Patient Re-evaluated prior to inductionOxygen Delivery Method: Circle System Utilized Preoxygenation: Pre-oxygenation with 100% oxygen Intubation Type: IV induction Ventilation: Mask ventilation without difficulty LMA: LMA inserted LMA Size: 5.0 Number of attempts: 1 Placement Confirmation: positive ETCO2 Tube secured with: Tape Dental Injury: Teeth and Oropharynx as per pre-operative assessment

## 2011-07-16 NOTE — Anesthesia Postprocedure Evaluation (Signed)
Anesthesia Post Note  Patient: Wesley [name].  Procedure(s) Performed: Procedure(s) (LRB): FULGURATION ANAL WART (N/A) EXAM UNDER ANESTHESIA (N/A)  Anesthesia type: General  Patient location: PACU  Post pain: Pain level controlled  Post assessment: Patient's Cardiovascular Status Stable  Last Vitals:  Filed Vitals:   07/16/11 1153  BP: 120/68  Pulse: 72  Temp: 36.4 C  Resp: 16    Post vital signs: Reviewed and stable  Level of consciousness: alert  Complications: No apparent anesthesia complications

## 2011-07-16 NOTE — Op Note (Signed)
Preop diagnosis: Anal condyloma  Postop diagnosis: Same  Procedure: Fulguration anal condyloma  Surgeon: Harriette Bouillon M.D.  Anesthesia: Gen.  EBL: Minimal  Specimens: None  Indications for procedure: The patient presents to anal condyloma. He was offered medical versus surgical treatment versus observation. He wished to have these fulgurated and the operating room. Risks, benefits and alternative therapies discussed. Risk of surgical intervention include bleeding, infection, and more surgery.  He  agrees to proceed.  Description of procedure: The patient was met in the holding area and questions were answered.  He was placed supine in on the OR table and general anesthesia was started.  He was then placed in lithotomy and perineum prepped and draped in a sterile fashion.Rectal exam was normal.  Anoscopy was norma.  Anal verge showed multiple clusters of condyloma and these were fulgurated with cautery with smoke evacuator.  Hemostasis achieved.  Dupicane oint ment applied.  Anal block of 0.25% bupivicaine used.  Final counts correct.  Pt extubated and taken to recovery in satisfactory condition.

## 2011-07-16 NOTE — Interval H&P Note (Signed)
History and Physical Interval Note:  07/16/2011 10:06 AM  Wesley [name].  has presented today for surgery, with the diagnosis of ANAL CONDYLOMA   The various methods of treatment have been discussed with the patient and family. After consideration of risks, benefits and other options for treatment, the patient has consented to  Procedure(s) (LRB): FULGURATION ANAL WART (N/A) EXAM UNDER ANESTHESIA (N/A) as a surgical intervention .  The patients' history has been reviewed, patient examined, no change in status, stable for surgery.  I have reviewed the patients' chart and labs.  Questions were answered to the patient's satisfaction.     Laurelle Skiver A.

## 2011-07-18 ENCOUNTER — Encounter (HOSPITAL_BASED_OUTPATIENT_CLINIC_OR_DEPARTMENT_OTHER): Payer: Self-pay | Admitting: Surgery

## 2011-09-11 ENCOUNTER — Encounter (INDEPENDENT_AMBULATORY_CARE_PROVIDER_SITE_OTHER): Payer: Self-pay | Admitting: Surgery

## 2011-09-11 ENCOUNTER — Ambulatory Visit (INDEPENDENT_AMBULATORY_CARE_PROVIDER_SITE_OTHER): Payer: BC Managed Care – PPO | Admitting: Surgery

## 2011-09-11 VITALS — BP 132/84 | HR 76 | Temp 97.6°F | Resp 18 | Ht 68.0 in | Wt 202.2 lb

## 2011-09-11 DIAGNOSIS — Z9889 Other specified postprocedural states: Secondary | ICD-10-CM

## 2011-09-11 NOTE — Progress Notes (Signed)
Patient returns after Fulguration anal condyloma. He states there were areas that have recurred. This area below his scrotum to that was not treated at this time of surgery. He is healed well.  Exam: Anal canal showed some small polypoid lesions. There are 4 polypoid lesions at the base of the scrotum as well. I applied 25% podophyllin to these areas in the office.  Impression: Anal condyloma  Plan: Return in 4 weeks for further surveillance and to see if  helpful. We'll try liquid nitrogen it this fails in the future. Instructions for care given.

## 2011-09-11 NOTE — Patient Instructions (Signed)
Return 1 month

## 2011-10-10 ENCOUNTER — Encounter (INDEPENDENT_AMBULATORY_CARE_PROVIDER_SITE_OTHER): Payer: Self-pay | Admitting: Surgery

## 2011-10-10 ENCOUNTER — Ambulatory Visit (INDEPENDENT_AMBULATORY_CARE_PROVIDER_SITE_OTHER): Payer: BC Managed Care – PPO | Admitting: Surgery

## 2011-10-10 VITALS — BP 122/64 | HR 68 | Temp 96.9°F | Resp 14 | Ht 69.0 in | Wt 202.2 lb

## 2011-10-10 DIAGNOSIS — Z9889 Other specified postprocedural states: Secondary | ICD-10-CM

## 2011-10-10 NOTE — Progress Notes (Signed)
Patient returns in followup for his anal condyloma. He has no complaints.  Exam: Anal canal shows resolution of documented polypoid lesions. Lesions on the scrotum present and are pigmented. No progression of these after treatment podyphyllin.  Impression: Anal condyloma status post excision and subsequent topical treatment  Plan: I wonder if the lesions at the base of the scrotum are not condyloma and are flat skin tags or small moles. He would like to have these excised L. bring him back to the office to do that. I discussed surveillance with him.

## 2011-10-10 NOTE — Patient Instructions (Signed)
RETURN FOR EXCISION OF SKIN LESIONS

## 2011-10-29 ENCOUNTER — Other Ambulatory Visit (INDEPENDENT_AMBULATORY_CARE_PROVIDER_SITE_OTHER): Payer: Self-pay

## 2011-10-29 ENCOUNTER — Ambulatory Visit (INDEPENDENT_AMBULATORY_CARE_PROVIDER_SITE_OTHER): Payer: BC Managed Care – PPO | Admitting: Surgery

## 2011-10-29 ENCOUNTER — Encounter (INDEPENDENT_AMBULATORY_CARE_PROVIDER_SITE_OTHER): Payer: Self-pay | Admitting: Surgery

## 2011-10-29 VITALS — BP 138/72 | HR 70 | Temp 98.4°F | Resp 20 | Ht 69.0 in | Wt 203.2 lb

## 2011-10-29 DIAGNOSIS — L918 Other hypertrophic disorders of the skin: Secondary | ICD-10-CM

## 2011-10-29 DIAGNOSIS — L909 Atrophic disorder of skin, unspecified: Secondary | ICD-10-CM

## 2011-10-29 NOTE — Addendum Note (Signed)
Addended by: Brennan Bailey on: 10/29/2011 02:41 PM   Modules accepted: Orders

## 2011-10-29 NOTE — Progress Notes (Signed)
Subjective:     Patient ID: Wesley [name]., male   DOB: [birth year], 51 y.o.   MRN: [id number]  HPI Patient returns do to skin tags noted at the base of his scrotum and his perineum or causing discomfort. They're causing some local irritation and hygiene issues for him. He would like to have these removed. He has a history of anal condyloma but these appear to be skin tags or nevi.  Review of Systems  Constitutional: Negative.   HENT: Negative.   Eyes: Negative.   Respiratory: Negative.   Cardiovascular: Negative.        Objective:   Physical Exam  Genitourinary:          Assessment:     Perineal skin tags    Plan:     The patient wished to have these excised. Risk of bleeding infection and recurrence. Under sterile conditions the perineum was prepped with Betadine. 1% lidocaine with epinephrine was used and injected the 3 pigmented skin tags in the perineum both scalpel and scissors were to excise the 1 cm lesions. Silver nitrate placed in the wound. Neosporin applied. Procedure tolerated well. Return 3 months for followup was anal condyloma or sooner if he has any problems.

## 2011-10-29 NOTE — Patient Instructions (Signed)
Apply neosporin to wounds in perineum.  Wear pad as needed.  Return 3 months for recheck or sooner if you have problems.

## 2011-11-05 ENCOUNTER — Telehealth (INDEPENDENT_AMBULATORY_CARE_PROVIDER_SITE_OTHER): Payer: Self-pay | Admitting: General Surgery

## 2011-11-05 NOTE — Telephone Encounter (Signed)
Spoke with patient and informed him that his pathology came back as no cancer but they were small warts.

## 2012-02-03 ENCOUNTER — Ambulatory Visit (INDEPENDENT_AMBULATORY_CARE_PROVIDER_SITE_OTHER): Payer: BC Managed Care – PPO | Admitting: Surgery

## 2012-02-03 ENCOUNTER — Encounter (INDEPENDENT_AMBULATORY_CARE_PROVIDER_SITE_OTHER): Payer: Self-pay | Admitting: Surgery

## 2012-02-03 VITALS — BP 138/74 | HR 67 | Temp 98.2°F | Resp 18 | Ht 69.0 in | Wt 202.2 lb

## 2012-02-03 DIAGNOSIS — Z8619 Personal history of other infectious and parasitic diseases: Secondary | ICD-10-CM

## 2012-02-03 NOTE — Patient Instructions (Signed)
Return 3 months

## 2012-02-03 NOTE — Progress Notes (Signed)
Subjective:     Patient ID: Wesley [name]., male   DOB: [birth year], 51 y.o.   MRN: [id number]  HPI  Patient returns in followup for anal condyloma. He is doing well no complaints. He has no history of atypia. No rectal bleeding or pain. Review of Systems  Constitutional: Negative.   HENT: Negative.   Eyes: Negative.   Respiratory: Negative.   Gastrointestinal: Negative.        Objective:   Physical Exam  Constitutional: He appears well-developed and well-nourished.  HENT:  Head: Normocephalic and atraumatic.  Eyes: EOM are normal. Pupils are equal, round, and reactive to light.  Genitourinary:     Skin: Skin is warm and dry.  Psychiatric: He has a normal mood and affect. His behavior is normal. Thought content normal.       Assessment:     History of anal condyloma    Plan:     Return in 3 months

## 2012-05-10 ENCOUNTER — Encounter (INDEPENDENT_AMBULATORY_CARE_PROVIDER_SITE_OTHER): Payer: Self-pay | Admitting: Surgery

## 2012-05-10 ENCOUNTER — Ambulatory Visit (INDEPENDENT_AMBULATORY_CARE_PROVIDER_SITE_OTHER): Payer: BC Managed Care – PPO | Admitting: Surgery

## 2012-05-10 VITALS — BP 130/88 | HR 75 | Temp 97.8°F | Resp 16 | Ht 68.0 in | Wt 204.6 lb

## 2012-05-10 DIAGNOSIS — A630 Anogenital (venereal) warts: Secondary | ICD-10-CM

## 2012-05-10 NOTE — Patient Instructions (Signed)
Medicine aplied to anal condyloma.  You may have some soreness and bleeding.   Will have you see Dr Maisie Fus in follow up .

## 2012-05-10 NOTE — Progress Notes (Signed)
Subjective:     Patient ID: Wesley [name]., male   DOB: Oct 04, 1960, 52 y.o.   MRN: [id number]  HPI  Patient returns in followup for anal condyloma. He is doing well no complaints. He has no history of atypia. No rectal bleeding or pain. Review of Systems  Constitutional: Negative.   HENT: Negative.   Eyes: Negative.   Respiratory: Negative.   Gastrointestinal: Negative.        Objective:   Physical Exam  Constitutional: He appears well-developed and well-nourished.  HENT:  Head: Normocephalic and atraumatic.  Eyes: EOM are normal. Pupils are equal, round, and reactive to light.  Genitourinary:     [employer] consent obtained for excision of skin tag and treatment with podophyllin4 small anal condyloma left anal canal and perineum  Treated with 25 % podophyllin.  I.  Pt wished to proceed.  Small skin tag right thigh excised under local anesthesia using  1 % lidocaine.  Tolerated well.  Tone normal.    Skin: Skin is warm and dry.  Psychiatric: He has a normal mood and affect. His behavior is normal. Thought content normal.       Assessment:     History of anal condyloma    Plan:     Return in 3 months to see Dr Maisie Fus for possible anoscopy and to follow long term given risk of malignancy.

## 2012-08-10 ENCOUNTER — Encounter (INDEPENDENT_AMBULATORY_CARE_PROVIDER_SITE_OTHER): Payer: Self-pay | Admitting: General Surgery

## 2012-08-10 ENCOUNTER — Ambulatory Visit (INDEPENDENT_AMBULATORY_CARE_PROVIDER_SITE_OTHER): Payer: BC Managed Care – PPO | Admitting: General Surgery

## 2012-08-10 VITALS — BP 124/77 | HR 72 | Temp 98.6°F | Resp 14 | Ht 68.0 in | Wt 200.6 lb

## 2012-08-10 DIAGNOSIS — A630 Anogenital (venereal) warts: Secondary | ICD-10-CM

## 2012-08-10 NOTE — Progress Notes (Signed)
Chief Complaint  Patient presents with  . Follow-up    HISTORY: Wesley [name]. is a 52 y.o. male who presents to the office with recurrent anal condyloma.  This had been occurring for at least a year.  He has tried podophyllin and cautery ablation in the past with some success.  He does not have HIV or any high risk sexual practices.     Past Medical History  Diagnosis Date  . Headache(784.0)     once/week  . Acid reflux     occasional; no current med.  . 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 Dental crown present   . High cholesterol       Past Surgical History  Procedure Laterality Date  . Hernia repair  > 20 yrs.    inguinal  . Wart fulguration  07/16/2011    Procedure: FULGURATION ANAL WART;  Surgeon: Clovis Pu. Cornett, MD;  Location: [facility];  Service: General;  Laterality: N/A;  . Examination under anesthesia  07/16/2011    Procedure: EXAM UNDER ANESTHESIA;  Surgeon: Maisie Fus A. Cornett, MD;  Location: [facility];  Service: General;  Laterality: N/A;        Current Outpatient Prescriptions  Medication Sig Dispense Refill  . Multiple Vitamin (MULTIVITAMIN) tablet Take 1 tablet by mouth daily.      . simvastatin (ZOCOR) 40 MG tablet 40 mg daily. AM       No current facility-administered medications for this visit.      No Known Allergies    Family History  Problem Relation Age of Onset  . Heart disease Father     History   Social History  . Marital Status: Married    Spouse Name: N/A    Number of Children: N/A  . Years of Education: N/A   Social History Main Topics  . 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 Smokeless tobacco: Never Used  . Alcohol Use: 1.2 oz/week    2 Glasses of wine per week     Comment: daily  . Drug Use: No  . Sexually Active: None   Other Topics Concern  . None   Social History Narrative  . None      REVIEW OF SYSTEMS - PERTINENT POSITIVES  ONLY: Review of Systems - General ROS: negative for - chills, fever or weight loss Hematological and Lymphatic ROS: negative for - bleeding problems, blood clots or bruising Respiratory ROS: no cough, shortness of breath, or wheezing Cardiovascular ROS: no chest pain or dyspnea on exertion Gastrointestinal ROS: no abdominal pain, change in bowel habits, or black or bloody stools Genito-Urinary ROS: no dysuria, trouble voiding, or hematuria  EXAM: Filed Vitals:   08/10/12 1122  BP: 124/77  Pulse: 72  Temp: 98.6 F (37 C)  Resp: 14    General appearance: alert and cooperative Resp: clear to auscultation bilaterally Cardio: regular rate and rhythm GI: soft, non-tender; bowel sounds normal; no masses,  no organomegaly  Anal Findings: several small condyloma noted perianally.  Some noted on post gluteal cleft.      ASSESSMENT AND PLAN: Wesley [name]. is a 52 y.o. M with recurrent anal condyloma.  I have recommended HRA and laser ablation.  We discussed the management of anal warts. We discussed chemical destruction, immunotherapy, and surgical excision. I discussed the pros and cons of each approach. We discussed the risk and benefits and the expected  outcome with chemical destruction with agents such as podophyllin. I explained that podophyllin is generally not been effective and has a high recurrence rate. We discussed the use of Aldara ointment. I explained that it has a 30-70% chance at resolving or at least reducing the number of anal warts. I explained that it is applied 3 times a week at night and left on overnight. I explained that skin irritation is the most common side effect. We then discussed surgical excision specifically excision and fulguration. I explained how the surgery is performed. I explained that it can be painful however it generally has the highest success rate. We discussed the risk and benefits of surgery including but not limited to bleeding, infection, injury to  surrounding structures, need to do a formal anoscopic exam to evaluate for anal canal warts, urinary retention, wart recurrence, and general anesthesia risk. We discussed the typical aftercare.  He will call us to schedule this at his convenience.        Vanita Panda, MD Colon and Rectal Surgery / General Surgery [facility]      Visit Diagnoses: 1. Anal condyloma     Primary Care Physician: Daisy Floro, MD

## 2012-08-10 NOTE — Patient Instructions (Signed)
Anal Warts  What are anal warts? Anal warts (also called "condyloma acuminata") are a condition that affects the area around and inside the anus. They may also affect the skin of the genital area. They first appear as tiny spots or growths, perhaps as small as the head of a pin, and may grow quite large and cover the entire anal area. Usually, they do not cause pain or discomfort to afflicted individuals and patients may be unaware that the warts are present. Some patients will experience symptoms, such as itching, bleeding, mucus discharge and/or a feeling of a lump or mass in the anal area.  What causes anal warts? They are caused by the human papilloma virus (HPV), which is transmitted from person to person by direct contact. HPV is considered a sexually transmitted disease (STD). You do not have to have anal intercourse to develop anal warts. Do anal warts always need to be removed? Yes. If they are not removed, the warts usually grow larger and multiply. Left untreated, the warts may lead to an increased risk of cancer in the affected area. What treatments are available? If warts are very small and are located only on the skin around the anus, they may be treated with a topical medication. They may also be treated by freezing the warts with liquid nitrogen or removed surgically. Surgery typically involves cutting or burning the warts off. While this provides immediate results, it must be performed using either a local anesthetic - such as novocaine - or a general or spinal anesthetic, depending on the number and exact location of warts being treated. It is important that an internal anal examination with an instrument called an anoscope be done by your treating physician to ensure you do not have any inside the anal canal (internal anal warts). Internal anal warts may not be as suitable for treatment by topical medications, and may need to be treated surgically. Additionally, your physician may wish to  examine the entire pelvic region to include the vaginal or penile area to look for other warts that may require treatment. Must I be hospitalized for surgical treatment? Surgical treatment of anal warts is usually performed as outpatient surgery. How much time will I lose from work after surgical treatment? Most people are moderately uncomfortable for a few days after treatment and pain medication may be prescribed. Depending on the extent of the disease, some people return to work the next day, while others may remain out of work for several days to weeks. Will a single treatment cure the problem? When warts are extensive, your surgeon may wish to perform the surgery in stages. Additionally, recurrent warts are common. The virus that causes the warts can live concealed in tissues that appear normal for several months before another wart develops. As new warts develop, they usually can be treated in the physician's office. Sometimes new warts develop so rapidly that office treatment would be quite uncomfortable. In these situations, a second and, occasionally, third outpatient surgical visit may be recommended. How long is treatment usually continued? Follow-up visits are necessary at frequent intervals for several months after all warts appear to be gone, to be certain that no new warts occur. What can be done to avoid getting these warts again? In some cases, warts may recur repeatedly after successful removal, since the virus that causes the warts often persists in a dormant state in body tissues. Discuss with your physician how often you should be evaluated for recurrent warts. Abstain from sexual   contact with individuals who have anal (or genital) warts. Since many individuals may be unaware that they suffer from this condition, sexual abstinence, condom protection or limiting sexual contact to single partner will reduce your potential exposure to the contagious virus that causes these warts. As a  precaution, sexual partners ought to be checked for warts and other sexual transmitted diseases, even if they have no symptoms. What is a colon and rectal surgeon? Colon and rectal surgeons are experts in the surgical and non-surgical treatment of diseases of the colon, rectum and anus. They have completed advanced surgical training in the treatment of these diseases as well as full general surgical training. Board-certified colon and rectal surgeons complete residencies in general surgery and colon and rectal surgery, and pass intensive examinations conducted by the American Board of Surgery and the American Board of Colon and Rectal Surgery. They are well-versed in the treatment of both benign and malignant diseases of the colon, rectum and anus and are able to perform routine screening examinations and surgically treat conditions if indicated to do so. author: Jennifer Lowney, MD, FASCRS, on behalf of the ASCRS Public Relations Committee  2012 American Society of Colon & Rectal Surgeons   

## 2012-08-26 ENCOUNTER — Encounter (HOSPITAL_BASED_OUTPATIENT_CLINIC_OR_DEPARTMENT_OTHER): Payer: Self-pay | Admitting: *Deleted

## 2012-08-26 NOTE — Progress Notes (Signed)
NPO AFTER MN.  ARRIVE AT 0700.  NEEDS HG.  

## 2012-09-22 ENCOUNTER — Encounter (HOSPITAL_BASED_OUTPATIENT_CLINIC_OR_DEPARTMENT_OTHER): Payer: Self-pay | Admitting: *Deleted

## 2012-09-22 NOTE — Progress Notes (Signed)
NPO AFTER MN. ARRIVES AT 0600. NEEDS HG.  

## 2012-09-29 NOTE — Anesthesia Preprocedure Evaluation (Addendum)
Anesthesia Evaluation  Patient identified by MRN, date of birth, ID band Patient awake    Reviewed: Allergy & Precautions, H&P , NPO status , Patient's Chart, lab work & pertinent test results  Airway Mallampati: II TM Distance: >3 FB Neck ROM: full    Dental no notable dental hx. (+) Teeth Intact and Dental Advisory Given   Pulmonary neg pulmonary ROS,  breath sounds clear to auscultation  Pulmonary exam normal       Cardiovascular Exercise Tolerance: Good negative cardio ROS  Rhythm:regular Rate:Normal     Neuro/Psych negative neurological ROS  negative psych ROS   GI/Hepatic negative GI ROS, Neg liver ROS,   Endo/Other  negative endocrine ROS  Renal/GU negative Renal ROS  negative genitourinary   Musculoskeletal   Abdominal   Peds  Hematology negative hematology ROS (+)   Anesthesia Other Findings   Reproductive/Obstetrics negative OB ROS                           Anesthesia Physical Anesthesia Plan  ASA: I  Anesthesia Plan: MAC   Post-op Pain Management:    Induction:   Airway Management Planned: Simple Face Mask  Additional Equipment:   Intra-op Plan:   Post-operative Plan:   Informed Consent: I have reviewed the patients History and Physical, chart, labs and discussed the procedure including the risks, benefits and alternatives for the proposed anesthesia with the patient or authorized representative who has indicated his/her understanding and acceptance.   Dental Advisory Given  Plan Discussed with: CRNA and Surgeon  Anesthesia Plan Comments:         Anesthesia Quick Evaluation  

## 2012-09-30 ENCOUNTER — Encounter (HOSPITAL_BASED_OUTPATIENT_CLINIC_OR_DEPARTMENT_OTHER): Payer: Self-pay | Admitting: Certified Registered"

## 2012-09-30 ENCOUNTER — Encounter (HOSPITAL_BASED_OUTPATIENT_CLINIC_OR_DEPARTMENT_OTHER): Payer: Self-pay | Admitting: *Deleted

## 2012-09-30 ENCOUNTER — Ambulatory Visit (HOSPITAL_BASED_OUTPATIENT_CLINIC_OR_DEPARTMENT_OTHER)
Admission: RE | Admit: 2012-09-30 | Discharge: 2012-09-30 | Disposition: A | Discharge status: Home / Self Care | Payer: BC Managed Care – PPO | Source: Ambulatory Visit | Attending: General Surgery | Admitting: General Surgery

## 2012-09-30 ENCOUNTER — Ambulatory Visit (HOSPITAL_BASED_OUTPATIENT_CLINIC_OR_DEPARTMENT_OTHER): Payer: BC Managed Care – PPO | Admitting: Certified Registered"

## 2012-09-30 ENCOUNTER — Encounter (HOSPITAL_BASED_OUTPATIENT_CLINIC_OR_DEPARTMENT_OTHER)
Admission: RE | Disposition: A | Discharge status: Home / Self Care | Payer: Self-pay | Source: Ambulatory Visit | Attending: General Surgery

## 2012-09-30 DIAGNOSIS — F17200 Nicotine dependence, unspecified, uncomplicated: Secondary | ICD-10-CM | POA: Insufficient documentation

## 2012-09-30 DIAGNOSIS — A630 Anogenital (venereal) warts: Secondary | ICD-10-CM | POA: Insufficient documentation

## 2012-09-30 DIAGNOSIS — E7800 Pure hypercholesterolemia, unspecified: Secondary | ICD-10-CM | POA: Insufficient documentation

## 2012-09-30 DIAGNOSIS — Z79899 Other long term (current) drug therapy: Secondary | ICD-10-CM | POA: Insufficient documentation

## 2012-09-30 DIAGNOSIS — K219 Gastro-esophageal reflux disease without esophagitis: Secondary | ICD-10-CM | POA: Insufficient documentation

## 2012-09-30 HISTORY — DX: Hyperlipidemia, unspecified: E78.5

## 2012-09-30 HISTORY — PX: HIGH RESOLUTION ANOSCOPY: SHX6345

## 2012-09-30 HISTORY — PX: LASER ABLATION CONDOLAMATA: SHX5941

## 2012-09-30 HISTORY — PX: EXAMINATION UNDER ANESTHESIA: SHX1540

## 2012-09-30 LAB — POCT HEMOGLOBIN-HEMACUE: Hemoglobin: 16.1 g/dL (ref 13.0–17.0)

## 2012-09-30 SURGERY — EXAM UNDER ANESTHESIA
Anesthesia: Monitor Anesthesia Care | Site: Anus | Wound class: Clean Contaminated

## 2012-09-30 MED ORDER — LIDOCAINE 5 % EX OINT
TOPICAL_OINTMENT | CUTANEOUS | Status: DC | PRN
  Administered 2012-09-30: 1

## 2012-09-30 MED ORDER — FENTANYL CITRATE 0.05 MG/ML IJ SOLN
INTRAMUSCULAR | Status: DC | PRN
  Administered 2012-09-30: 25 ug via INTRAVENOUS
  Administered 2012-09-30: 50 ug via INTRAVENOUS
  Administered 2012-09-30: 25 ug via INTRAVENOUS

## 2012-09-30 MED ORDER — OXYCODONE HCL 5 MG PO TABS: 5.0000 mg | ORAL_TABLET | Freq: Four times a day (QID) | ORAL | Status: DC | PRN

## 2012-09-30 MED ORDER — ACETAMINOPHEN 650 MG RE SUPP
650.0000 mg | RECTAL | Status: DC | PRN
  Filled 2012-09-30: qty 1

## 2012-09-30 MED ORDER — ACETIC ACID 5 % SOLN
Status: DC | PRN
  Administered 2012-09-30: 1 via TOPICAL

## 2012-09-30 MED ORDER — SILVER SULFADIAZINE 1 % EX CREA
TOPICAL_CREAM | CUTANEOUS | Status: DC | PRN
  Administered 2012-09-30: 1 via TOPICAL

## 2012-09-30 MED ORDER — LACTATED RINGERS IV SOLN
INTRAVENOUS | Status: DC
  Administered 2012-09-30 (×2): via INTRAVENOUS
  Filled 2012-09-30: qty 1000

## 2012-09-30 MED ORDER — ONDANSETRON HCL 4 MG/2ML IJ SOLN
4.0000 mg | Freq: Four times a day (QID) | INTRAMUSCULAR | Status: DC | PRN
  Administered 2012-09-30: 4 mg via INTRAVENOUS
  Filled 2012-09-30: qty 2

## 2012-09-30 MED ORDER — OXYCODONE HCL 5 MG PO TABS
5.0000 mg | ORAL_TABLET | ORAL | Status: DC | PRN
  Filled 2012-09-30: qty 2

## 2012-09-30 MED ORDER — MIDAZOLAM HCL 5 MG/5ML IJ SOLN
INTRAMUSCULAR | Status: DC | PRN
  Administered 2012-09-30: 2 mg via INTRAVENOUS

## 2012-09-30 MED ORDER — BUPIVACAINE-EPINEPHRINE 0.25% -1:200000 IJ SOLN
INTRAMUSCULAR | Status: DC | PRN
  Administered 2012-09-30: 30 mL

## 2012-09-30 MED ORDER — DOCUSATE SODIUM 100 MG PO CAPS: 100.0000 mg | ORAL_CAPSULE | Freq: Two times a day (BID) | ORAL | Status: DC

## 2012-09-30 MED ORDER — SILVER SULFADIAZINE 1 % EX CREA: TOPICAL_CREAM | Freq: Two times a day (BID) | CUTANEOUS | Status: DC

## 2012-09-30 MED ORDER — PROPOFOL 10 MG/ML IV EMUL
INTRAVENOUS | Status: DC | PRN
  Administered 2012-09-30: 140 ug/kg/min via INTRAVENOUS

## 2012-09-30 MED ORDER — SODIUM CHLORIDE 0.9 % IJ SOLN
3.0000 mL | INTRAMUSCULAR | Status: DC | PRN
  Filled 2012-09-30: qty 3

## 2012-09-30 MED ORDER — SODIUM CHLORIDE 0.9 % IV SOLN
500.0000 mg | INTRAVENOUS | Status: DC | PRN
  Administered 2012-09-30: 30 ug/kg/min via INTRAVENOUS

## 2012-09-30 MED ORDER — SODIUM CHLORIDE 0.9 % IV SOLN
250.0000 mL | INTRAVENOUS | Status: DC | PRN
  Filled 2012-09-30: qty 250

## 2012-09-30 MED ORDER — FENTANYL CITRATE 0.05 MG/ML IJ SOLN
25.0000 ug | INTRAMUSCULAR | Status: DC | PRN
  Filled 2012-09-30: qty 1

## 2012-09-30 MED ORDER — SODIUM CHLORIDE 0.9 % IJ SOLN
3.0000 mL | Freq: Two times a day (BID) | INTRAMUSCULAR | Status: DC
  Filled 2012-09-30: qty 3

## 2012-09-30 MED ORDER — LIDOCAINE 5 % EX OINT: TOPICAL_OINTMENT | CUTANEOUS | Status: DC | PRN

## 2012-09-30 MED ORDER — ACETAMINOPHEN 325 MG PO TABS
650.0000 mg | ORAL_TABLET | ORAL | Status: DC | PRN
  Filled 2012-09-30: qty 2

## 2012-09-30 MED ORDER — LACTATED RINGERS IV SOLN
INTRAVENOUS | Status: DC
  Filled 2012-09-30: qty 1000

## 2012-09-30 MED ORDER — LIDOCAINE HCL (CARDIAC) 20 MG/ML IV SOLN
INTRAVENOUS | Status: DC | PRN
  Administered 2012-09-30: 50 mg via INTRAVENOUS

## 2012-09-30 SURGICAL SUPPLY — 56 items
APL SKNCLS STERI-STRIP NONHPOA (GAUZE/BANDAGES/DRESSINGS) ×1
BENZOIN TINCTURE PRP APPL 2/3 (GAUZE/BANDAGES/DRESSINGS) ×2 IMPLANT
BLADE HEX COATED 2.75 (ELECTRODE) ×2 IMPLANT
BLADE SURG 10 STRL SS (BLADE) IMPLANT
BLADE SURG 15 STRL LF DISP TIS (BLADE) ×1 IMPLANT
BLADE SURG 15 STRL SS (BLADE) ×2
BRIEF STRETCH FOR OB PAD LRG (UNDERPADS AND DIAPERS) ×2 IMPLANT
CANISTER SUCTION 2500CC (MISCELLANEOUS) ×2 IMPLANT
CLOTH BEACON ORANGE TIMEOUT ST (SAFETY) ×2 IMPLANT
COVER MAYO STAND STRL (DRAPES) ×2 IMPLANT
COVER TABLE BACK 60X90 (DRAPES) ×2 IMPLANT
DECANTER SPIKE VIAL GLASS SM (MISCELLANEOUS) ×2 IMPLANT
DRAPE LG THREE QUARTER DISP (DRAPES) ×4 IMPLANT
DRAPE PED LAPAROTOMY (DRAPES) ×2 IMPLANT
DRAPE UNDERBUTTOCKS STRL (DRAPE) IMPLANT
DRSG PAD ABDOMINAL 8X10 ST (GAUZE/BANDAGES/DRESSINGS) ×2 IMPLANT
ELECT BLADE 6.5 .24CM SHAFT (ELECTRODE) IMPLANT
ELECT REM PT RETURN 9FT ADLT (ELECTROSURGICAL) ×2
ELECTRODE REM PT RTRN 9FT ADLT (ELECTROSURGICAL) ×1 IMPLANT
GAUZE SPONGE 4X4 12PLY STRL LF (GAUZE/BANDAGES/DRESSINGS) ×2 IMPLANT
GAUZE SPONGE 4X4 16PLY XRAY LF (GAUZE/BANDAGES/DRESSINGS) ×2 IMPLANT
GAUZE VASELINE 3X9 (GAUZE/BANDAGES/DRESSINGS) IMPLANT
GLOVE BIO SURGEON STRL SZ 6 (GLOVE) ×1 IMPLANT
GLOVE BIO SURGEON STRL SZ 6.5 (GLOVE) ×5 IMPLANT
GLOVE BIOGEL PI IND STRL 7.0 (GLOVE) ×2 IMPLANT
GLOVE BIOGEL PI INDICATOR 7.0 (GLOVE) ×1
GLOVE INDICATOR 7.0 STRL GRN (GLOVE) ×2 IMPLANT
GLOVE INDICATOR 7.5 STRL GRN (GLOVE) ×1 IMPLANT
GOWN PREVENTION PLUS LG XLONG (DISPOSABLE) ×2 IMPLANT
GOWN PREVENTION PLUS XLARGE (GOWN DISPOSABLE) ×2 IMPLANT
GOWN STRL REIN XL XLG (GOWN DISPOSABLE) ×2 IMPLANT
HEMOSTAT SURGICEL 2X14 (HEMOSTASIS) ×2 IMPLANT
LEGGING LITHOTOMY PAIR STRL (DRAPES) IMPLANT
NDL HYPO 25X1 1.5 SAFETY (NEEDLE) ×1 IMPLANT
NDL SAFETY ECLIPSE 18X1.5 (NEEDLE) ×1 IMPLANT
NEEDLE HYPO 18GX1.5 SHARP (NEEDLE) ×2
NEEDLE HYPO 22GX1.5 SAFETY (NEEDLE) ×2 IMPLANT
NEEDLE HYPO 25X1 1.5 SAFETY (NEEDLE) ×2 IMPLANT
NS IRRIG 500ML POUR BTL (IV SOLUTION) ×2 IMPLANT
PACK BASIN DAY SURGERY FS (CUSTOM PROCEDURE TRAY) ×2 IMPLANT
PENCIL BUTTON HOLSTER BLD 10FT (ELECTRODE) ×2 IMPLANT
SPONGE GAUZE 4X4 12PLY (GAUZE/BANDAGES/DRESSINGS) ×2 IMPLANT
SPONGE SURGIFOAM ABS GEL 12-7 (HEMOSTASIS) IMPLANT
SUT CHROMIC 2 0 SH (SUTURE) IMPLANT
SUT CHROMIC 3 0 SH 27 (SUTURE) IMPLANT
SUT MON AB 3-0 SH 27 (SUTURE)
SUT MON AB 3-0 SH27 (SUTURE) IMPLANT
SUT VIC AB 4-0 P-3 18XBRD (SUTURE) IMPLANT
SUT VIC AB 4-0 P3 18 (SUTURE)
SYR BULB IRRIGATION 50ML (SYRINGE) ×2 IMPLANT
SYR CONTROL 10ML LL (SYRINGE) ×2 IMPLANT
TOWEL OR 17X24 6PK STRL BLUE (TOWEL DISPOSABLE) ×4 IMPLANT
TRAY DSU PREP LF (CUSTOM PROCEDURE TRAY) ×3 IMPLANT
TUBE CONNECTING 12X1/4 (SUCTIONS) ×2 IMPLANT
VACUUM HOSE 7/8X10 W/ WAND (MISCELLANEOUS) ×1 IMPLANT
YANKAUER SUCT BULB TIP NO VENT (SUCTIONS) ×2 IMPLANT

## 2012-09-30 NOTE — Op Note (Addendum)
09/30/2012  8:11 AM  PATIENT:  Wesley [name]  52 y.o. male  Patient Care Team: Gildardo Cranker, MD as PCP - General (Family Medicine)  PRE-OPERATIVE DIAGNOSIS:  anal condyloma  POST-OPERATIVE DIAGNOSIS:  anal condyloma  PROCEDURE:   EXAM UNDER ANESTHESIA HIGH RESOLUTION ANOSCOPY LASER ABLATION CONDOLOMA  SURGEON:  Surgeon(s): Romie Levee, MD  ASSISTANT: none   ANESTHESIA:   local and IV sedation  EBL: min  Total I/O In: 200 [I.V.:200] Out: -   Delay start of Pharmacological VTE agent (>24hrs) due to surgical blood loss or risk of bleeding:  no  DRAINS: none   SPECIMEN:  No Specimen  DISPOSITION OF SPECIMEN:  N/A  COUNTS:  YES  PLAN OF CARE: Discharge to home after PACU  PATIENT DISPOSITION:  PACU - hemodynamically stable.  INDICATION: this is a 52 year old male who is status post condyloma ablation and fulguration approximately a year ago. He has recurrence of his disease, which appears to be mostly lateral to his previous site.  OR FINDINGS: multiple small pigmented condyloma perianally. Small nonpigmented condyloma internally.  DESCRIPTION: The patient was identified in the preoperative holding area and taken to the OR where they were laid prone on the operating room table in jackknife position. MAC anesthesia was smoothly induced.  The patient was then prepped and draped in the usual sterile fashion. A surgical timeout was performed indicating the correct patient, procedure, positioning and preoperative antibioitics. SCDs were noted to be in place and functioning prior to the operation.   After this was completed, a sponge was soaked in 5% acetic acid was placed over the perianal region. This was allowed to soak for 2 minutes. The sponge was removed and the perianal region was evaluated with the colposcope.  It was painted with Betadine after this.  There were multiple condylomatous lesions identified.  There were no lesions that stained appearing to be  dysplastic.  The internal anal canal was evaluated via anoscopy with a Hill-Ferguson anoscope.  There were multiple condylomatous lesions seen, but no dysplastic appearing lesions noted.  Therefore I did not take any biopsies.  Next the laser was brought onto the field and covered with a sterile drape.  The edges of the operative field were draped with wet towels.  Appropriate ventilation was obtained.  All staff were protected with small particle masks and goggles safe for the laser.  The laser was then activated. All condylomatous lesions were ablated. Hemostasis was then achieved using electrocautery. A thin layer of Silvadene was then placed over the lesions. A sterile dressing was applied over this. The patient was then awakened from anesthesia and sent to the postanesthesia care unit in stable condition. All counts were correct operating room staff. I was present and personally performed the entire procedure.

## 2012-09-30 NOTE — H&P (Signed)
HISTORY: Wesley Kamel. is a 52 y.o. male who presents to the office with recurrent anal condyloma. This had been occurring for at least a year. He has tried podophyllin and cautery ablation in the past with some success. He does not have HIV or any high risk sexual practices.  Past Medical History   Diagnosis  Date   .  Headache(784.0)      once/week   .  Acid reflux      occasional; no current med.   .  Anal condyloma  07/2011   .  Dental crown present    .  High cholesterol     Past Surgical History   Procedure  Laterality  Date   .  Hernia repair   > 20 yrs.     inguinal   .  Wart fulguration   07/16/2011     Procedure: FULGURATION ANAL WART; Surgeon: Clovis Pu. Cornett, MD; Location: [facility]; Service: General; Laterality: N/A;   .  Examination under anesthesia   07/16/2011     Procedure: EXAM UNDER ANESTHESIA; Surgeon: Maisie Fus A. Cornett, MD; Location: [facility]; Service: General; Laterality: N/A;    Current Outpatient Prescriptions   Medication  Sig  Dispense  Refill   .  Multiple Vitamin (MULTIVITAMIN) tablet  Take 1 tablet by mouth daily.     .  simvastatin (ZOCOR) 40 MG tablet  40 mg daily. AM      No current facility-administered medications for this visit.   No Known Allergies  Family History   Problem  Relation  Age of Onset   .  Heart disease  Father     History    Social History   .  Marital Status:  Married     Spouse Name:  N/A     Number of Children:  N/A   .  Years of Education:  N/A    Social History Main Topics   .  Smoking status:  Current Every Day Smoker -- 1.00 packs/day for 27 years     Types:  Cigarettes   .  Smokeless tobacco:  Never Used   .  Alcohol Use:  1.2 oz/week     2 Glasses of wine per week      Comment: daily   .  Drug Use:  No   .  Sexually Active:  None    Other Topics  Concern   .  None    Social History Narrative   .  None    REVIEW OF SYSTEMS - PERTINENT POSITIVES ONLY:  Review of  Systems - General ROS: negative for - chills, fever or weight loss  Hematological and Lymphatic ROS: negative for - bleeding problems, blood clots or bruising  Respiratory ROS: no cough, shortness of breath, or wheezing  Cardiovascular ROS: no chest pain or dyspnea on exertion  Gastrointestinal ROS: no abdominal pain, change in bowel habits, or black or bloody stools  Genito-Urinary ROS: no dysuria, trouble voiding, or hematuria   EXAM:  There were no vitals filed for this visit. General appearance: alert and cooperative  Resp: clear to auscultation bilaterally  Cardio: regular rate and rhythm  GI: soft, non-tender; bowel sounds normal; no masses, no organomegaly  Previous Anal Findings: several small condyloma noted perianally. Some noted on post gluteal cleft.   ASSESSMENT AND PLAN:  Wesley Klippel. is a 52 y.o. M with recurrent anal condyloma. I have recommended HRA and laser ablation. We  discussed the management of anal warts. We discussed chemical destruction, immunotherapy, and surgical excision. I discussed the pros and cons of each approach. We discussed the risk and benefits and the expected outcome with chemical destruction with agents such as podophyllin. I explained that podophyllin is generally not been effective and has a high recurrence rate. We discussed the use of Aldara ointment. I explained that it has a 30-70% chance at resolving or at least reducing the number of anal warts. I explained that it is applied 3 times a week at night and left on overnight. I explained that skin irritation is the most common side effect. We then discussed surgical excision specifically excision and fulguration. I explained how the surgery is performed. I explained that it can be painful however it generally has the highest success rate. We discussed the risk and benefits of surgery including but not limited to bleeding, infection, injury to surrounding structures, need to do a formal anoscopic exam  to evaluate for anal canal warts, urinary retention, wart recurrence, and general anesthesia risk. We discussed the typical aftercare. He will call us to schedule this at his convenience.

## 2012-09-30 NOTE — Transfer of Care (Signed)
Immediate Anesthesia Transfer of Care Note  Patient: Wesley [name].  Procedure(s) Performed: Procedure(s) (LRB): EXAM UNDER ANESTHESIA (N/A) HIGH RESOLUTION ANOSCOPY (N/A) LASER ABLATION CONDOLAMATA (N/A)  Patient Location: PACU  Anesthesia Type: MAC  Level of Consciousness: awake, alert , oriented and patient cooperative  Airway & Oxygen Therapy: Patient Spontanous Breathing and Patient connected to face mask oxygen  Post-op Assessment: Report given to PACU RN and Post -op Vital signs reviewed and stable  Post vital signs: Reviewed and stable  Complications: No apparent anesthesia complications

## 2012-09-30 NOTE — Anesthesia Postprocedure Evaluation (Signed)
  Anesthesia Post-op Note  Patient: Wesley [name].  Procedure(s) Performed: Procedure(s) (LRB): EXAM UNDER ANESTHESIA (N/A) HIGH RESOLUTION ANOSCOPY (N/A) LASER ABLATION CONDOLAMATA (N/A)  Patient Location: PACU  Anesthesia Type: MAC  Level of Consciousness: awake and alert   Airway and Oxygen Therapy: Patient Spontanous Breathing  Post-op Pain: mild  Post-op Assessment: Post-op Vital signs reviewed, Patient's Cardiovascular Status Stable, Respiratory Function Stable, Patent Airway and No signs of Nausea or vomiting  Last Vitals:  Filed Vitals:   09/30/12 0915  BP: 136/78  Pulse: 75  Temp:   Resp: 11    Post-op Vital Signs: stable   Complications: No apparent anesthesia complications

## 2012-10-01 ENCOUNTER — Encounter (HOSPITAL_BASED_OUTPATIENT_CLINIC_OR_DEPARTMENT_OTHER): Payer: Self-pay | Admitting: General Surgery

## 2012-10-06 ENCOUNTER — Telehealth (INDEPENDENT_AMBULATORY_CARE_PROVIDER_SITE_OTHER): Payer: Self-pay

## 2012-10-06 NOTE — Telephone Encounter (Signed)
Called pt and scheduled a follow up appointment for 8/21 at 4:30

## 2012-10-21 ENCOUNTER — Ambulatory Visit (INDEPENDENT_AMBULATORY_CARE_PROVIDER_SITE_OTHER): Payer: BC Managed Care – PPO | Admitting: General Surgery

## 2012-10-21 ENCOUNTER — Encounter (INDEPENDENT_AMBULATORY_CARE_PROVIDER_SITE_OTHER): Payer: Self-pay | Admitting: General Surgery

## 2012-10-21 VITALS — BP 132/88 | HR 72 | Temp 99.3°F | Resp 16 | Ht 69.0 in | Wt 205.4 lb

## 2012-10-21 DIAGNOSIS — A630 Anogenital (venereal) warts: Secondary | ICD-10-CM

## 2012-10-21 MED ORDER — IMIQUIMOD 5 % EX CREA: TOPICAL_CREAM | CUTANEOUS | Status: AC

## 2012-10-21 NOTE — Patient Instructions (Addendum)
You can try imiquimod cream to help reduce your chance of recurrence.  Return to office in 3 months

## 2012-10-21 NOTE — Progress Notes (Signed)
Wesley [name]. is a 52 y.o. male who is status post a laser ablation of condyloma on 7/31.  He is doing well.  His pain is better.  Objective: Filed Vitals:   10/21/12 1710  BP: 132/88  Pulse: 72  Temp: 99.3 F (37.4 C)  Resp: 16    General appearance: alert and cooperative  Incision: healing well   Assessment: s/p  Patient Active Problem List   Diagnosis Date Noted  . Anal condyloma 05/10/2012  . 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 Cutaneous skin tags 10/29/2011    Plan: Doing well after condyloma ablation.  We discussed Aldera treatment to decrease recurrence.  I gave a prescription if he'd like to try this.  I will see him back in 3 months.    Vanita Panda, MD [facility], [place] [phone]   10/21/2012 5:28 PM

## 2013-01-05 ENCOUNTER — Encounter (INDEPENDENT_AMBULATORY_CARE_PROVIDER_SITE_OTHER): Payer: Self-pay | Admitting: General Surgery

## 2013-01-05 ENCOUNTER — Ambulatory Visit (INDEPENDENT_AMBULATORY_CARE_PROVIDER_SITE_OTHER): Payer: BC Managed Care – PPO | Admitting: General Surgery

## 2013-01-05 VITALS — BP 124/78 | HR 68 | Temp 98.2°F | Resp 15 | Ht 68.0 in | Wt 203.0 lb

## 2013-01-05 DIAGNOSIS — Z8619 Personal history of other infectious and parasitic diseases: Secondary | ICD-10-CM

## 2013-01-05 NOTE — Progress Notes (Signed)
Wesley [name]. is a 52 y.o. male who is here for a follow up visit regarding his anal condyloma.  He denies any new symptoms.  He is s/p laser ablation of recurrent anal condyloma in July. We discussed Aldara, but he decided not to do this at this time.   Objective: Filed Vitals:   01/05/13 1535  BP: 124/78  Pulse: 68  Temp: 98.2 F (36.8 C)  Resp: 15    General appearance: alert and cooperative GI: soft, non-tender; bowel sounds normal; no masses,  no organomegaly Perianal: no new condyloma noted externally or internally.  Assessment and Plan: Doing well.  No signs of recurrence.  Pt will f/u in 3 months.    Vanita Panda, MD [facility], [place] [phone]

## 2013-01-05 NOTE — Patient Instructions (Signed)
Return to the office in 3 months 

## 2013-04-19 ENCOUNTER — Encounter (INDEPENDENT_AMBULATORY_CARE_PROVIDER_SITE_OTHER): Payer: Self-pay | Admitting: General Surgery

## 2013-04-19 ENCOUNTER — Ambulatory Visit (INDEPENDENT_AMBULATORY_CARE_PROVIDER_SITE_OTHER): Payer: BC Managed Care – PPO | Admitting: General Surgery

## 2013-04-19 VITALS — BP 132/84 | HR 78 | Temp 98.0°F | Resp 14 | Ht 68.0 in | Wt 200.0 lb

## 2013-04-19 DIAGNOSIS — A630 Anogenital (venereal) warts: Secondary | ICD-10-CM

## 2013-04-19 NOTE — Progress Notes (Signed)
Wesley CanterburyJames O Mahadeo Jr. is a 53 y.o. male who is here for a follow up visit regarding his anal condyloma. He denies any new symptoms. He is s/p laser ablation of recurrent anal condyloma in July. We discussed Aldara, but he decided not to do this at this time.   Objective:  Filed Vitals:   04/19/13 1222  BP: 132/84  Pulse: 78  Temp: 98 F (36.7 C)  Resp: 14    General appearance: alert and cooperative  GI: soft, non-tender; bowel sounds normal; no masses, no organomegaly   Procedure: Anoscopy Surgeon: Wesley [name] Assistant: Wesley DikeJennifer MA After the risks and benefits were explained, verbal consent was obtained for above procedure  Anesthesia: none Diagnosis: h/o anal condyloma Findings: no new condyloma noted externally or internally.  Small anterior flat pigmented lesions externally.    Assessment and Plan:  Doing well. No signs of recurrence. Pt will f/u in July with one last DRE evaluation.     Vanita Panda.Shenea Giacobbe C Benay Pomeroy, MD  [facility], [place]  [phone]

## 2013-04-19 NOTE — Patient Instructions (Signed)
Return to the office in 4 months.

## 2013-07-26 ENCOUNTER — Ambulatory Visit (INDEPENDENT_AMBULATORY_CARE_PROVIDER_SITE_OTHER): Payer: BC Managed Care – PPO | Admitting: General Surgery

## 2013-08-15 ENCOUNTER — Ambulatory Visit (INDEPENDENT_AMBULATORY_CARE_PROVIDER_SITE_OTHER): Payer: BC Managed Care – PPO | Admitting: General Surgery

## 2013-08-23 ENCOUNTER — Ambulatory Visit (INDEPENDENT_AMBULATORY_CARE_PROVIDER_SITE_OTHER): Payer: BC Managed Care – PPO | Admitting: General Surgery

## 2013-08-23 ENCOUNTER — Encounter (INDEPENDENT_AMBULATORY_CARE_PROVIDER_SITE_OTHER): Payer: Self-pay | Admitting: General Surgery

## 2013-08-23 VITALS — BP 118/62 | HR 70 | Temp 98.0°F | Resp 18 | Ht 68.0 in | Wt 197.0 lb

## 2013-08-23 DIAGNOSIS — A630 Anogenital (venereal) warts: Secondary | ICD-10-CM

## 2013-08-23 DIAGNOSIS — Z8619 Personal history of other infectious and parasitic diseases: Secondary | ICD-10-CM

## 2013-08-23 NOTE — Patient Instructions (Signed)
Return to the office in 6 months for recheck.   Fiber Chart  You should 25-30g of fiber per day and drinking 8 glasses of water to help your bowels move regularly.  In the chart below you can look up how much fiber you are getting in an average day.  If you are not getting enough fiber, you should add a fiber supplement to your diet.  Examples of this include Metamucil, FiberCon and Citrucel.  These can be purchased at your local grocery store or pharmacy.      [email or web address].[employer].edu/offices/health/nutritioncoach/AtoZ/handouts/Fiber.pdf

## 2013-08-23 NOTE — Progress Notes (Signed)
Clyde CanterburyJames O Bhattacharyya Jr. is a 53 y.o. male who is here for a follow up visit regarding his anal condyloma. He denies any new symptoms except for some mild irritation when sitting for long periods of time. He is s/p laser ablation of recurrent anal condyloma in July 2014.  Objective:  Filed Vitals:   08/23/13 1218  BP: 118/62  Pulse: 70  Temp: 98 F (36.7 C)  Resp: 18    General appearance: alert and cooperative  GI: soft, non-tender; bowel sounds normal; no masses, no organomegaly   Procedure: Anoscopy  Surgeon: Maisie Fushomas  Assistant: Felix AhmadiStaten  After the risks and benefits were explained, verbal consent was obtained for above procedure  Anesthesia: none  Diagnosis: h/o anal condyloma  Findings: no new condyloma noted externally or internally. Small anterior flat pigmented lesions externally. Small raised lesion externally on left perianal region scar, does not appear to be condyloma  Assessment and Plan:  Doing well. No signs of recurrence. Pt will f/u in 6 months for repeat evaluation per his request.   .Vanita PandaAlicia C Orrie Schubert, MD  [facility], [place]  [phone]

## 2015-07-23 DIAGNOSIS — R42 Dizziness and giddiness: Secondary | ICD-10-CM | POA: Diagnosis not present

## 2015-08-06 DIAGNOSIS — Z0000 Encounter for general adult medical examination without abnormal findings: Secondary | ICD-10-CM | POA: Diagnosis not present

## 2015-08-20 DIAGNOSIS — H9313 Tinnitus, bilateral: Secondary | ICD-10-CM | POA: Diagnosis not present

## 2015-08-21 DIAGNOSIS — R0683 Snoring: Secondary | ICD-10-CM | POA: Diagnosis not present

## 2015-08-29 DIAGNOSIS — L905 Scar conditions and fibrosis of skin: Secondary | ICD-10-CM | POA: Diagnosis not present

## 2015-09-18 DIAGNOSIS — A630 Anogenital (venereal) warts: Secondary | ICD-10-CM | POA: Diagnosis not present

## 2015-09-25 DIAGNOSIS — A630 Anogenital (venereal) warts: Secondary | ICD-10-CM | POA: Diagnosis not present

## 2015-10-02 DIAGNOSIS — J342 Deviated nasal septum: Secondary | ICD-10-CM | POA: Diagnosis not present

## 2015-10-02 DIAGNOSIS — J301 Allergic rhinitis due to pollen: Secondary | ICD-10-CM | POA: Diagnosis not present

## 2015-10-02 DIAGNOSIS — J320 Chronic maxillary sinusitis: Secondary | ICD-10-CM | POA: Diagnosis not present

## 2015-10-02 DIAGNOSIS — J322 Chronic ethmoidal sinusitis: Secondary | ICD-10-CM | POA: Diagnosis not present

## 2015-10-09 DIAGNOSIS — J320 Chronic maxillary sinusitis: Secondary | ICD-10-CM | POA: Diagnosis not present

## 2015-10-09 DIAGNOSIS — J342 Deviated nasal septum: Secondary | ICD-10-CM | POA: Diagnosis not present

## 2015-10-09 DIAGNOSIS — J301 Allergic rhinitis due to pollen: Secondary | ICD-10-CM | POA: Diagnosis not present

## 2015-10-09 DIAGNOSIS — J330 Polyp of nasal cavity: Secondary | ICD-10-CM | POA: Diagnosis not present

## 2015-10-25 DIAGNOSIS — H9313 Tinnitus, bilateral: Secondary | ICD-10-CM | POA: Diagnosis not present

## 2015-10-25 DIAGNOSIS — J320 Chronic maxillary sinusitis: Secondary | ICD-10-CM | POA: Diagnosis not present

## 2015-10-25 DIAGNOSIS — H903 Sensorineural hearing loss, bilateral: Secondary | ICD-10-CM | POA: Diagnosis not present

## 2015-10-25 DIAGNOSIS — J301 Allergic rhinitis due to pollen: Secondary | ICD-10-CM | POA: Diagnosis not present

## 2015-11-19 ENCOUNTER — Ambulatory Visit (HOSPITAL_COMMUNITY)
Admission: EM | Admit: 2015-11-19 | Discharge: 2015-11-19 | Disposition: A | Discharge status: Home / Self Care | Payer: BLUE CROSS/BLUE SHIELD | Attending: Emergency Medicine | Admitting: Emergency Medicine

## 2015-11-19 ENCOUNTER — Encounter (HOSPITAL_COMMUNITY): Payer: Self-pay | Admitting: Emergency Medicine

## 2015-11-19 DIAGNOSIS — H6092 Unspecified otitis externa, left ear: Secondary | ICD-10-CM | POA: Diagnosis not present

## 2015-11-19 MED ORDER — IBUPROFEN 800 MG PO TABS: 800.0000 mg | ORAL_TABLET | Freq: Three times a day (TID) | ORAL | 0 refills | Status: DC

## 2015-11-19 MED ORDER — NEOMYCIN-POLYMYXIN-HC 3.5-10000-1 OT SUSP: 4.0000 [drp] | Freq: Four times a day (QID) | OTIC | 0 refills | Status: DC

## 2015-11-19 NOTE — ED Provider Notes (Signed)
HPI  SUBJECTIVE:  Wesley CanterburyJames O Philbin Jr. is a 55 y.o. male who presents with 1 week of throbbing, constant left ear pain. States that he has been swimming recently. He states that it is worse with chewing and opening his mouth and palpation and lying on his left side, better with Advil. He has also tried peroxide and  Q-tips to help "clean it out". He take Advil 400 mg last night. No fevers, otorrhea, exposure to loud noise, trauma to his ear. No hearing changes, upper respiratory like symptoms, sore throat. He has had bilateral tinnitus for the past several months it is not changed today. No facial rash. No dental pain. He does report some ear swelling. He does not grind his teeth at night. No facial droop, no tick bite, visual changes. Past medical history of chickenpox. He has no history of TMJ dysfunction, diabetes, hypertension, shingles, steroid use, immunocompromise, HIV, cancer. PMD: Dr. Tenny Crawoss.    Past Medical History:  Diagnosis Date  . Acid reflux    occasional; no current med.  . 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 Hyperlipemia     Past Surgical History:  Procedure Laterality Date  . EXAMINATION UNDER ANESTHESIA  07/16/2011   Procedure: EXAM UNDER ANESTHESIA;  Surgeon: Maisie Fushomas A. Cornett, MD;  Location: [facility];  Service: General;  Laterality: N/A;  . EXAMINATION UNDER ANESTHESIA N/A 09/30/2012   Procedure: EXAM UNDER ANESTHESIA;  Surgeon: Romie LeveeAlicia Thomas, MD;  [facility];  Service: General;  Laterality: N/A;  . HIGH RESOLUTION ANOSCOPY N/A 09/30/2012   Procedure: HIGH RESOLUTION ANOSCOPY;  Surgeon: Romie LeveeAlicia Thomas, MD;  Location: [facility];  Service: General;  Laterality: N/A;  . INGUINAL HERNIA REPAIR  1980'S  . LASER ABLATION CONDOLAMATA N/A 09/30/2012   Procedure: LASER ABLATION CONDOLAMATA;  Surgeon: Romie LeveeAlicia Thomas, MD;  [facility] [place];  Service: General;  Laterality: N/A;  . WART FULGURATION  07/16/2011   Procedure:  FULGURATION ANAL WART;  Surgeon: Clovis Puhomas A. Cornett, MD;  Location: [facility];  Service: General;  Laterality: N/A;    Family History  Problem Relation Age of Onset  . Heart disease Father     Social History  Substance Use Topics  . Smoking status: Current Every Day Smoker    Packs/day: 1.00    Years: 27.00    Types: Cigarettes  . Smokeless tobacco: Never Used  . Alcohol use 8.4 oz/week    14 Glasses of wine per week     Comment: 2 WINE PER DAY    No current facility-administered medications for this encounter.   Current Outpatient Prescriptions:  .  simvastatin (ZOCOR) 40 MG tablet, Take 40 mg by mouth daily. AM, Disp: , Rfl:  .  ibuprofen (ADVIL,MOTRIN) 800 MG tablet, Take 1 tablet (800 mg total) by mouth 3 (three) times daily., Disp: 30 tablet, Rfl: 0 .  neomycin-polymyxin-hydrocortisone (CORTISPORIN) 3.5-10000-1 otic suspension, Place 4 drops into the left ear 4 (four) times daily. X 7 days, Disp: 7.5 mL, Rfl: 0  No Known Allergies   ROS  As noted in HPI.   Physical Exam  BP 138/78 (BP Location: Left Arm)   Pulse 67   Temp 98.3 F (36.8 C) (Oral)   Resp 12   SpO2 99%   Constitutional: Well developed, well nourished, no acute distress Eyes:  EOMI, conjunctiva normal bilaterally HENT: Normocephalic, atraumatic,mucus membranes moist. Left ear red, swollen with yellowish crusting. Positive erythema, swelling external ear canal. Positive whitish  debris. Unable to fully visualize TM. Pain with traction on pinna.  tenderness over the TMJ. No mastoid tenderness. right external ear canal and TM within normal limits Neck: No cervical lymphadenopathy. Respiratory: Normal inspiratory effort Cardiovascular: Normal rate GI: nondistended skin: No vesicular rash in external ear canal. No facial rash Musculoskeletal: no deformities Neurologic: Alert & oriented x 3, no focal neuro deficits Psychiatric: Speech and behavior appropriate   ED  Course   Medications - No data to display  No orders of the defined types were placed in this encounter.   No results found for this or any previous visit (from the past 24 hour(s)). No results found.  ED Clinical Impression  Otitis externa, left   ED Assessment/Plan   tenderness over TMJ is more likely due to the external otitis rather than TMJ dysfunction. Will send home with Cortisporin suspension, ibuprofen 800 mg, advised patient not get ear wet. He is to follow-up with his PMD, or Dr. Haroldine Laws, his ENT, or here if not better in a week.  Discussed MDM, plan and followup with patient.  Patient agrees with plan.   *This clinic note was created using Dragon dictation software. Therefore, there may be occasional mistakes despite careful proofreading.  ?   Domenick Gong, MD 11/19/15 1313

## 2015-11-19 NOTE — ED Triage Notes (Signed)
The patient presented to the [facility] with a complaint of left ear pain x 1 week. 

## 2015-11-19 NOTE — Discharge Instructions (Signed)
Do not get your ear wet and until you feel better. You may take 1 g of Tylenol with the ibuprofen up to 3 times a day. This is an effective combination for pain. Follow up with your doctor, Dr. Haroldine Lawsrossley, or here if you're not better in 10 days. Go to the ER if you get worse.

## 2015-11-21 DIAGNOSIS — H6062 Unspecified chronic otitis externa, left ear: Secondary | ICD-10-CM | POA: Diagnosis not present

## 2015-11-21 DIAGNOSIS — H6502 Acute serous otitis media, left ear: Secondary | ICD-10-CM | POA: Diagnosis not present

## 2015-11-21 DIAGNOSIS — H6122 Impacted cerumen, left ear: Secondary | ICD-10-CM | POA: Diagnosis not present

## 2015-11-26 DIAGNOSIS — H6122 Impacted cerumen, left ear: Secondary | ICD-10-CM | POA: Diagnosis not present

## 2015-11-26 DIAGNOSIS — H6062 Unspecified chronic otitis externa, left ear: Secondary | ICD-10-CM | POA: Diagnosis not present

## 2015-11-27 ENCOUNTER — Encounter (HOSPITAL_COMMUNITY): Payer: Self-pay | Admitting: Emergency Medicine

## 2015-11-27 ENCOUNTER — Emergency Department (HOSPITAL_COMMUNITY)
Admission: EM | Admit: 2015-11-27 | Discharge: 2015-11-28 | Disposition: A | Discharge status: Home / Self Care | Payer: BLUE CROSS/BLUE SHIELD | Attending: Emergency Medicine | Admitting: Emergency Medicine

## 2015-11-27 ENCOUNTER — Emergency Department (HOSPITAL_COMMUNITY): Payer: BLUE CROSS/BLUE SHIELD

## 2015-11-27 DIAGNOSIS — R109 Unspecified abdominal pain: Secondary | ICD-10-CM | POA: Diagnosis not present

## 2015-11-27 DIAGNOSIS — N23 Unspecified renal colic: Secondary | ICD-10-CM

## 2015-11-27 DIAGNOSIS — F17210 Nicotine dependence, cigarettes, uncomplicated: Secondary | ICD-10-CM | POA: Diagnosis not present

## 2015-11-27 LAB — URINE MICROSCOPIC-ADD ON

## 2015-11-27 LAB — CBC
HCT: 43.4 % (ref 39.0–52.0)
Hemoglobin: 15.1 g/dL (ref 13.0–17.0)
MCH: 32.1 pg (ref 26.0–34.0)
MCHC: 34.8 g/dL (ref 30.0–36.0)
MCV: 92.1 fL (ref 78.0–100.0)
Platelets: 321 10*3/uL (ref 150–400)
RBC: 4.71 MIL/uL (ref 4.22–5.81)
RDW: 12.3 % (ref 11.5–15.5)
WBC: 16.8 10*3/uL — ABNORMAL HIGH (ref 4.0–10.5)

## 2015-11-27 LAB — I-STAT CHEM 8, ED
BUN: 15 mg/dL (ref 6–20)
Calcium, Ion: 1.1 mmol/L — ABNORMAL LOW (ref 1.15–1.40)
Chloride: 108 mmol/L (ref 101–111)
Creatinine, Ser: 1 mg/dL (ref 0.61–1.24)
Glucose, Bld: 106 mg/dL — ABNORMAL HIGH (ref 65–99)
HCT: 45 % (ref 39.0–52.0)
Hemoglobin: 15.3 g/dL (ref 13.0–17.0)
Potassium: 3.6 mmol/L (ref 3.5–5.1)
Sodium: 139 mmol/L (ref 135–145)
TCO2: 20 mmol/L (ref 0–100)

## 2015-11-27 LAB — URINALYSIS, ROUTINE W REFLEX MICROSCOPIC
Bilirubin Urine: NEGATIVE
Glucose, UA: NEGATIVE mg/dL
Ketones, ur: NEGATIVE mg/dL
Leukocytes, UA: NEGATIVE
Nitrite: NEGATIVE
Protein, ur: NEGATIVE mg/dL
Specific Gravity, Urine: 1.023 (ref 1.005–1.030)
pH: 5 (ref 5.0–8.0)

## 2015-11-27 LAB — LIPASE, BLOOD: Lipase: 30 U/L (ref 11–51)

## 2015-11-27 MED ORDER — OXYCODONE-ACETAMINOPHEN 5-325 MG PO TABS: 2.0000 | ORAL_TABLET | Freq: Four times a day (QID) | ORAL | 0 refills | Status: DC | PRN

## 2015-11-27 MED ORDER — KETOROLAC TROMETHAMINE 30 MG/ML IJ SOLN
15.0000 mg | Freq: Once | INTRAMUSCULAR | Status: AC
  Administered 2015-11-27: 15 mg via INTRAVENOUS
  Filled 2015-11-27: qty 1

## 2015-11-27 MED ORDER — TAMSULOSIN HCL 0.4 MG PO CAPS: 0.4000 mg | ORAL_CAPSULE | Freq: Every day | ORAL | 0 refills | Status: DC

## 2015-11-27 MED ORDER — ONDANSETRON HCL 4 MG/2ML IJ SOLN
4.0000 mg | Freq: Once | INTRAMUSCULAR | Status: AC
  Administered 2015-11-27: 4 mg via INTRAVENOUS
  Filled 2015-11-27: qty 2

## 2015-11-27 MED ORDER — SODIUM CHLORIDE 0.9 % IV BOLUS (SEPSIS)
1000.0000 mL | Freq: Once | INTRAVENOUS | Status: AC
  Administered 2015-11-27: 1000 mL via INTRAVENOUS

## 2015-11-27 NOTE — ED Notes (Signed)
Patient transported to CT 

## 2015-11-27 NOTE — ED Notes (Signed)
EDP at bedside  

## 2015-11-27 NOTE — Discharge Instructions (Signed)
You have a 2 mm stone that passed from your kidney into your bladder.  Get rechecked immediately if you develop fevers, uncontrolled pain, can't urinate, or new worrisome symptoms.

## 2015-11-27 NOTE — ED Provider Notes (Signed)
[facility]-EMERGENCY DEPT Provider Note   CSN: [id number] Arrival date & time: 11/27/15  2015     History   Chief Complaint Chief Complaint  Patient presents with  . Emesis  . Flank Pain    HPI Wesley [name]. is a 55 y.o. male.  The history is provided by the patient. No language interpreter was used.  Emesis    Flank Pain    Wesley [name]. is a 55 y.o. male who presents to the Emergency Department complaining of flank pain.  At 7 PM this evening he developed sudden onset right flank pain. Pain is sharp and constant in nature. It radiates at times to his right lower quadrant. He has associated nausea, vomiting, diaphoresis. No fevers, dysuria. No prior similar symptoms. Past Medical History:  Diagnosis Date  . Acid reflux    occasional; no current med.  . 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 Hyperlipemia     Patient Active Problem List   Diagnosis Date Noted  . 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 Cutaneous skin tags 10/29/2011    Past Surgical History:  Procedure Laterality Date  . EXAMINATION UNDER ANESTHESIA  07/16/2011   Procedure: EXAM UNDER ANESTHESIA;  Surgeon: Maisie Fus A. Cornett, MD;  Location: [facility];  Service: General;  Laterality: N/A;  . EXAMINATION UNDER ANESTHESIA N/A 09/30/2012   Procedure: EXAM UNDER ANESTHESIA;  Surgeon: Romie Levee, MD;  Location: [facility] [place];  Service: General;  Laterality: N/A;  . HIGH RESOLUTION ANOSCOPY N/A 09/30/2012   Procedure: HIGH RESOLUTION ANOSCOPY;  Surgeon: Romie Levee, MD;  Location: [facility];  Service: General;  Laterality: N/A;  . INGUINAL HERNIA REPAIR  1980'S  . LASER ABLATION CONDOLAMATA N/A 09/30/2012   Procedure: LASER ABLATION CONDOLAMATA;  Surgeon: Romie Levee, MD;  Location: [facility];  Service: General;  Laterality: N/A;  . WART FULGURATION  07/16/2011   Procedure: FULGURATION ANAL WART;  Surgeon: Clovis Pu. Cornett, MD;  Location: [facility];  Service: General;  Laterality: N/A;       Home Medications    Prior to Admission medications   Medication Sig Start Date End Date Taking? Authorizing Provider  ciprofloxacin (CIPRO) 500 MG tablet Take 500 mg by mouth 2 (two) times daily. 11/21/15  Yes Historical Provider, MD  OTOVEL 0.3-0.025 % SOLN Place 1-2 drops into the left ear 2 (two) times daily. 11/21/15  Yes Historical Provider, MD  simvastatin (ZOCOR) 40 MG tablet Take 40 mg by mouth daily. AM 05/19/11  Yes Historical Provider, MD  ibuprofen (ADVIL,MOTRIN) 800 MG tablet Take 1 tablet (800 mg total) by mouth 3 (three) times daily. Patient not taking: Reported on 11/27/2015 11/19/15   Domenick Gong, MD  neomycin-polymyxin-hydrocortisone (CORTISPORIN) 3.5-10000-1 otic suspension Place 4 drops into the left ear 4 (four) times daily. X 7 days Patient not taking: Reported on 11/27/2015 11/19/15   Domenick Gong, MD  oxyCODONE-acetaminophen (PERCOCET/ROXICET) 5-325 MG tablet Take 2 tablets by mouth every 6 (six) hours as needed for severe pain. 11/27/15   Tilden Fossa, MD  tamsulosin (FLOMAX) 0.4 MG CAPS capsule Take 1 capsule (0.4 mg total) by mouth daily. 11/27/15   Tilden Fossa, MD    Family History Family History  Problem Relation Age of Onset  . Heart disease Father     Social History Social History  Substance Use Topics  . Smoking status: Current Every Day Smoker    Packs/day: 1.00    Years: 27.00  Types: Cigarettes  . Smokeless tobacco: Never Used  . Alcohol use 8.4 oz/week    14 Glasses of wine per week     Comment: 2 WINE PER DAY     Allergies   Review of patient's allergies indicates no known allergies.   Review of Systems Review of Systems  Gastrointestinal: Positive for vomiting.  Genitourinary: Positive for flank pain.  All other systems reviewed and are negative.    Physical Exam Updated Vital Signs BP 120/68 (BP Location: Right Arm)   Pulse 63   Temp 98.3 F (36.8 C)    Resp 22   Ht 5\' 9"  (1.753 m)   Wt 204 lb (92.5 kg)   SpO2 95%   BMI 30.13 kg/m   Physical Exam  Constitutional: He is oriented to person, place, and time. He appears well-developed and well-nourished.  HENT:  Head: Normocephalic and atraumatic.  Cardiovascular: Normal rate and regular rhythm.   No murmur heard. Pulmonary/Chest: Effort normal and breath sounds normal. No respiratory distress.  Abdominal: Soft. There is no tenderness. There is no rebound and no guarding.  Right CVA tenderness  Musculoskeletal: He exhibits no edema or tenderness.  Neurological: He is alert and oriented to person, place, and time.  Skin: Skin is warm and dry.  Psychiatric: He has a normal mood and affect. His behavior is normal.  Nursing note and vitals reviewed.    ED Treatments / Results  Labs (all labs ordered are listed, but only abnormal results are displayed) Labs Reviewed  CBC - Abnormal; Notable for the following:       Result Value   WBC 16.8 (*)    All other components within normal limits  URINALYSIS, ROUTINE W REFLEX MICROSCOPIC (NOT AT [facility]) - Abnormal; Notable for the following:    Hgb urine dipstick TRACE (*)    All other components within normal limits  URINE MICROSCOPIC-ADD ON - Abnormal; Notable for the following:    Squamous Epithelial / LPF 0-5 (*)    Bacteria, UA FEW (*)    Casts GRANULAR CAST (*)    All other components within normal limits  I-STAT CHEM 8, ED - Abnormal; Notable for the following:    Glucose, Bld 106 (*)    Calcium, Ion 1.10 (*)    All other components within normal limits  LIPASE, BLOOD    EKG  EKG Interpretation  Date/Time:  Tuesday November 27 2015 20:22:37 EDT Ventricular Rate:  56 PR Interval:  142 QRS Duration: 100 QT Interval:  442 QTC Calculation: 426 R Axis:   75 Text Interpretation:  Sinus bradycardia Otherwise normal ECG Confirmed by Lincoln Brigham [phone]) on 11/27/2015 9:31:53 PM       Radiology Ct Renal Stone Study  Result  Date: 11/27/2015 CLINICAL DATA:  Acute onset of right flank pain, nausea and vomiting. Initial encounter. EXAM: CT ABDOMEN AND PELVIS WITHOUT CONTRAST TECHNIQUE: Multidetector CT imaging of the abdomen and pelvis was performed following the standard protocol without IV contrast. COMPARISON:  None. FINDINGS: Lower chest: Mild bibasilar atelectasis or scarring is noted. The lung bases are otherwise clear. The visualized portions of the mediastinum are unremarkable. Hepatobiliary: The liver is unremarkable in appearance. The gallbladder is unremarkable in appearance. The common bile duct remains normal in caliber. Pancreas: The pancreas is within normal limits. Spleen: The spleen is unremarkable in appearance. Adrenals/Urinary Tract: The adrenal glands are unremarkable in appearance. The kidneys are within normal limits. There is no evidence of hydronephrosis. No renal or ureteral  stones are identified. There is slight asymmetric prominence of the right ureter, with minimal surrounding soft tissue stranding, which may reflect a recently passed stone, given that a 2 mm stone is noted at the left side of the base of the bladder. Stomach/Bowel: The stomach is unremarkable in appearance. The small bowel is within normal limits. The appendix is normal in caliber, without evidence of appendicitis. The colon is unremarkable in appearance. Vascular/Lymphatic: Minimal calcification is seen along the abdominal aorta and its branches. No retroperitoneal lymphadenopathy is seen. No pelvic sidewall lymphadenopathy is appreciated. Reproductive: The bladder is mildly distended and grossly unremarkable. The prostate remains normal in size, with minimal calcification. A small urachal remnant is incidentally seen. Other: A small umbilical hernia is noted, containing only fat. Musculoskeletal: No acute osseous abnormalities are identified. Vacuum phenomenon is noted at L5-S1, possibly within normal limits. The visualized musculature is  unremarkable in appearance. IMPRESSION: 1. Slightly asymmetric prominence of the right ureter, with minimal surrounding soft tissue stranding. This may reflect a recently passed stone, given that a 2 mm stone is noted at the left side of the base of the bladder. 2. No evidence of hydronephrosis. No obstructing ureteral stone currently seen. 3. Mild bibasilar atelectasis or scarring noted. 4. Small umbilical hernia, containing only fat. Electronically Signed   By: Roanna RaiderJeffery  Chang M.D.   On: 11/27/2015 23:19    Procedures Procedures (including critical care time)  Medications Ordered in ED Medications  ketorolac (TORADOL) 30 MG/ML injection 15 mg (15 mg Intravenous Given 11/27/15 2117)  ondansetron (ZOFRAN) injection 4 mg (4 mg Intravenous Given 11/27/15 2117)  sodium chloride 0.9 % bolus 1,000 mL (0 mLs Intravenous Stopped 11/27/15 2259)     Initial Impression / Assessment and Plan / ED Course  I have reviewed the triage vital signs and the nursing notes.  Pertinent labs & imaging results that were available during my care of the patient were reviewed by me and considered in my medical decision making (see chart for details).  Clinical Course    Patient here for violation of right flank pain, significantly improved on repeat evaluation. UA not consistent with UTI. CT study consistent with recently passed stone. Discussed with patient homecare for renal colic, neurology and PCP follow-up, return precautions.  Final Clinical Impressions(s) / ED Diagnoses   Final diagnoses:  Renal colic on right side    New Prescriptions New Prescriptions   OXYCODONE-ACETAMINOPHEN (PERCOCET/ROXICET) 5-325 MG TABLET    Take 2 tablets by mouth every 6 (six) hours as needed for severe pain.   TAMSULOSIN (FLOMAX) 0.4 MG CAPS CAPSULE    Take 1 capsule (0.4 mg total) by mouth daily.     Tilden FossaElizabeth Yaritzi Craun, MD 11/27/15 2356

## 2015-11-27 NOTE — ED Triage Notes (Signed)
Patient with right flank pain, radiating from back to groin on the right.  Patient having nausea and vomiting.  Patient is pacing in triage and diaphoretic.

## 2016-01-29 DIAGNOSIS — H524 Presbyopia: Secondary | ICD-10-CM | POA: Diagnosis not present

## 2016-03-13 DIAGNOSIS — A630 Anogenital (venereal) warts: Secondary | ICD-10-CM | POA: Diagnosis not present

## 2016-03-13 DIAGNOSIS — L821 Other seborrheic keratosis: Secondary | ICD-10-CM | POA: Diagnosis not present

## 2016-03-31 DIAGNOSIS — A630 Anogenital (venereal) warts: Secondary | ICD-10-CM | POA: Diagnosis not present

## 2016-08-19 DIAGNOSIS — Z0000 Encounter for general adult medical examination without abnormal findings: Secondary | ICD-10-CM | POA: Diagnosis not present

## 2016-09-02 DIAGNOSIS — K5720 Diverticulitis of large intestine with perforation and abscess without bleeding: Secondary | ICD-10-CM | POA: Diagnosis not present

## 2016-09-15 DIAGNOSIS — H00012 Hordeolum externum right lower eyelid: Secondary | ICD-10-CM | POA: Diagnosis not present

## 2016-09-15 DIAGNOSIS — H01006 Unspecified blepharitis left eye, unspecified eyelid: Secondary | ICD-10-CM | POA: Diagnosis not present

## 2016-09-15 DIAGNOSIS — H01003 Unspecified blepharitis right eye, unspecified eyelid: Secondary | ICD-10-CM | POA: Diagnosis not present

## 2016-10-27 DIAGNOSIS — A630 Anogenital (venereal) warts: Secondary | ICD-10-CM | POA: Diagnosis not present

## 2016-11-27 DIAGNOSIS — H01025 Squamous blepharitis left lower eyelid: Secondary | ICD-10-CM | POA: Diagnosis not present

## 2017-01-07 DIAGNOSIS — D225 Melanocytic nevi of trunk: Secondary | ICD-10-CM | POA: Diagnosis not present

## 2017-01-07 DIAGNOSIS — L821 Other seborrheic keratosis: Secondary | ICD-10-CM | POA: Diagnosis not present

## 2017-01-07 DIAGNOSIS — L570 Actinic keratosis: Secondary | ICD-10-CM | POA: Diagnosis not present

## 2017-01-07 DIAGNOSIS — L910 Hypertrophic scar: Secondary | ICD-10-CM | POA: Diagnosis not present

## 2017-08-31 DIAGNOSIS — R5381 Other malaise: Secondary | ICD-10-CM | POA: Diagnosis not present

## 2017-08-31 DIAGNOSIS — F17200 Nicotine dependence, unspecified, uncomplicated: Secondary | ICD-10-CM | POA: Diagnosis not present

## 2017-08-31 DIAGNOSIS — E782 Mixed hyperlipidemia: Secondary | ICD-10-CM | POA: Diagnosis not present

## 2017-08-31 DIAGNOSIS — Z125 Encounter for screening for malignant neoplasm of prostate: Secondary | ICD-10-CM | POA: Diagnosis not present

## 2017-08-31 DIAGNOSIS — Z0000 Encounter for general adult medical examination without abnormal findings: Secondary | ICD-10-CM | POA: Diagnosis not present

## 2017-10-20 ENCOUNTER — Other Ambulatory Visit: Payer: Self-pay | Admitting: Acute Care

## 2017-10-20 DIAGNOSIS — Z122 Encounter for screening for malignant neoplasm of respiratory organs: Secondary | ICD-10-CM

## 2017-10-20 DIAGNOSIS — F17210 Nicotine dependence, cigarettes, uncomplicated: Secondary | ICD-10-CM

## 2017-11-18 ENCOUNTER — Encounter: Payer: Self-pay | Admitting: Acute Care

## 2017-11-18 ENCOUNTER — Ambulatory Visit
Admission: RE | Admit: 2017-11-18 | Discharge: 2017-11-18 | Disposition: A | Discharge status: Home / Self Care | Payer: BLUE CROSS/BLUE SHIELD | Source: Ambulatory Visit | Attending: Acute Care | Admitting: Acute Care

## 2017-11-18 ENCOUNTER — Ambulatory Visit (INDEPENDENT_AMBULATORY_CARE_PROVIDER_SITE_OTHER): Payer: BLUE CROSS/BLUE SHIELD | Admitting: Acute Care

## 2017-11-18 DIAGNOSIS — F17210 Nicotine dependence, cigarettes, uncomplicated: Secondary | ICD-10-CM

## 2017-11-18 DIAGNOSIS — Z122 Encounter for screening for malignant neoplasm of respiratory organs: Secondary | ICD-10-CM

## 2017-11-18 NOTE — Progress Notes (Signed)
Shared Decision Making Visit Lung Cancer Screening Program [phone]([id number])   Eligibility:  Age 57 y.o.  Pack Years Smoking History Calculation 37 pack year smoking history (# packs/per year x # years smoked)  Recent History of coughing up blood  no  Unexplained weight loss? no ( >Than 15 pounds within the last 6 months )  Prior History Lung / other cancer no (Diagnosis within the last 5 years already requiring surveillance chest CT Scans).  Smoking Status Current Smoker  Former Smokers: Years since quit: NA  Quit Date: NA  Visit Components:  Discussion included one or more decision making aids. yes  Discussion included risk/benefits of screening. yes  Discussion included potential follow up diagnostic testing for abnormal scans. yes  Discussion included meaning and risk of over diagnosis. yes  Discussion included meaning and risk of False Positives. yes  Discussion included meaning of total radiation exposure. yes  Counseling Included:  Importance of adherence to annual lung cancer LDCT screening. yes  Impact of comorbidities on ability to participate in the program. yes  Ability and willingness to under diagnostic treatment. yes  Smoking Cessation Counseling:  Current Smokers:   Discussed importance of smoking cessation. yes  Information about tobacco cessation classes and interventions provided to patient. yes  Patient provided with "ticket" for LDCT Scan. yes  Symptomatic Patient. no  Counseling: NA  Diagnosis Code: Tobacco Use Z72.0  Asymptomatic Patient no  Counseling (Intermediate counseling: > three minutes counseling) [id number]  Former Smokers:   Discussed the importance of maintaining cigarette abstinence. yes  Diagnosis Code: Personal History of Nicotine Dependence. U98.119Z87.891  Information about tobacco cessation classes and interventions provided to patient. Yes  Patient provided with "ticket" for LDCT Scan. yes  Written Order for Lung Cancer  Screening with LDCT placed in Epic. Yes (CT Chest Lung Cancer Screening Low Dose W/O CM) [id number] Z12.2-Screening of respiratory organs Z87.891-Personal history of nicotine dependence  I have spent 25 minutes of face to face time with Mr. Huston FoleyBrady discussing the risks and benefits of lung cancer screening. We viewed a power point together that explained in detail the above noted topics. We paused at intervals to allow for questions to be asked and answered to ensure understanding.We discussed that the single most powerful action that he can take to decrease his risk of developing lung cancer is to quit smoking. We discussed whether or not he is ready to commit to setting a quit date. He is not ready to set a quit date.We discussed options for tools to aid in quitting smoking including nicotine replacement therapy, non-nicotine medications, support groups, Quit Smart classes, and behavior modification. We discussed that often times setting smaller, more achievable goals, such as eliminating 1 cigarette a day for a week and then 2 cigarettes a day for a week can be helpful in slowly decreasing the number of cigarettes smoked. This allows for a sense of accomplishment as well as providing a clinical benefit. I gave him the " Be Stronger Than Your Excuses" card with contact information for community resources, classes, free nicotine replacement therapy, and access to mobile apps, text messaging, and on-line smoking cessation help. I have also given him my card and contact information in the event he needs to contact me. We discussed the time and location of the scan, and that either Abigail Miyamotoenise Phelps RN or I will call with the results within 24-48 hours of receiving them. I have offered him  a copy of the power point we viewed  as  a resource in the event they need reinforcement of the concepts we discussed today in the office. The patient verbalized understanding of all of  the above and had no further questions upon  leaving the office. They have my contact information in the event they have any further questions.  I spent 4 minutes counseling on smoking cessation and the health risks of continued tobacco abuse.  I explained to the patient that there has been a high incidence of coronary artery disease noted on these exams. I explained that this is a non-gated exam therefore degree or severity cannot be determined. This patient is on statin therapy. I have asked the patient to follow-up with their PCP regarding any incidental finding of coronary artery disease and management with diet or medication as their PCP  feels is clinically indicated. The patient verbalized understanding of the above and had no further questions upon completion of the visit.      Bevelyn Ngo, NP 11/18/2017 9:40 AM

## 2017-11-23 ENCOUNTER — Telehealth: Payer: Self-pay | Admitting: Acute Care

## 2017-11-23 DIAGNOSIS — Z122 Encounter for screening for malignant neoplasm of respiratory organs: Secondary | ICD-10-CM

## 2017-11-23 DIAGNOSIS — F17210 Nicotine dependence, cigarettes, uncomplicated: Secondary | ICD-10-CM

## 2017-11-23 NOTE — Telephone Encounter (Signed)
Patient calling very upset that he had CT last Wednesday and has not heard anything from us about results.  Advised Angelique BlonderDenise is not in today, and should possibly be in tomorrow to call him but patient states he wants a call back today from someone.  CB is [phone].

## 2017-11-23 NOTE — Telephone Encounter (Signed)
Patient would like the result to his Low dose Ct scan form 11/18/17.   Routing to Kandice RobinsonsSarah  Groce, NP

## 2017-11-23 NOTE — Telephone Encounter (Signed)
I returned the patient's call at 17:20. It went to his voice mail and there was a message stating the mailbox was full. The results of the scan were read and documented by me 11/20/2017 at 1600. ( Last Friday) They can be found in the result note section of the scan. ( Pull up the scan results and scroll down, you will see my comments.) If you call the results to the patient please let Angelique BlonderDenise know. She will still need to place the order for the annual scan and fax the results to the PCP. Thanks so much

## 2017-11-24 NOTE — Telephone Encounter (Signed)
Spoke with pt and advised that Kandice RobinsonsSarah Groce, NP had tried to call him yesterday regarding CT results and was unable to leave a message.  Pt verbalized that  He was having issues with this phone yesterday afternoon. Pt informed of CT results per Kandice RobinsonsSarah Groce, NP.  PT verbalized understanding.  Copy sent to PCP.  Order placed for 1 yr f/u CT.

## 2017-11-24 NOTE — Telephone Encounter (Signed)
Wesley [name] is going to call patient this morning.

## 2017-12-10 DIAGNOSIS — A630 Anogenital (venereal) warts: Secondary | ICD-10-CM | POA: Diagnosis not present

## 2018-09-06 DIAGNOSIS — R05 Cough: Secondary | ICD-10-CM | POA: Diagnosis not present

## 2018-09-06 DIAGNOSIS — Z209 Contact with and (suspected) exposure to unspecified communicable disease: Secondary | ICD-10-CM | POA: Diagnosis not present

## 2018-09-06 DIAGNOSIS — Z03818 Encounter for observation for suspected exposure to other biological agents ruled out: Secondary | ICD-10-CM | POA: Diagnosis not present

## 2018-09-06 DIAGNOSIS — J209 Acute bronchitis, unspecified: Secondary | ICD-10-CM | POA: Diagnosis not present

## 2018-09-06 DIAGNOSIS — J019 Acute sinusitis, unspecified: Secondary | ICD-10-CM | POA: Diagnosis not present

## 2018-09-06 DIAGNOSIS — R509 Fever, unspecified: Secondary | ICD-10-CM | POA: Diagnosis not present

## 2019-03-14 DIAGNOSIS — A630 Anogenital (venereal) warts: Secondary | ICD-10-CM | POA: Diagnosis not present

## 2019-03-14 DIAGNOSIS — M545 Low back pain: Secondary | ICD-10-CM | POA: Diagnosis not present

## 2019-03-30 DIAGNOSIS — A630 Anogenital (venereal) warts: Secondary | ICD-10-CM | POA: Diagnosis not present

## 2019-06-01 DIAGNOSIS — R251 Tremor, unspecified: Secondary | ICD-10-CM | POA: Diagnosis not present

## 2019-06-01 DIAGNOSIS — E782 Mixed hyperlipidemia: Secondary | ICD-10-CM | POA: Diagnosis not present

## 2019-06-01 DIAGNOSIS — M2560 Stiffness of unspecified joint, not elsewhere classified: Secondary | ICD-10-CM | POA: Diagnosis not present

## 2019-06-01 DIAGNOSIS — Z125 Encounter for screening for malignant neoplasm of prostate: Secondary | ICD-10-CM | POA: Diagnosis not present

## 2019-06-01 DIAGNOSIS — Z79899 Other long term (current) drug therapy: Secondary | ICD-10-CM | POA: Diagnosis not present

## 2019-06-21 DIAGNOSIS — R251 Tremor, unspecified: Secondary | ICD-10-CM | POA: Diagnosis not present

## 2019-06-21 DIAGNOSIS — M79642 Pain in left hand: Secondary | ICD-10-CM | POA: Diagnosis not present

## 2019-07-12 DIAGNOSIS — R251 Tremor, unspecified: Secondary | ICD-10-CM | POA: Diagnosis not present

## 2019-07-12 DIAGNOSIS — M79642 Pain in left hand: Secondary | ICD-10-CM | POA: Diagnosis not present

## 2019-08-02 DIAGNOSIS — G20 Parkinson's disease: Secondary | ICD-10-CM

## 2019-08-02 DIAGNOSIS — G20C Parkinsonism, unspecified: Secondary | ICD-10-CM

## 2019-08-02 HISTORY — DX: Parkinsonism, unspecified: G20.C

## 2019-08-02 HISTORY — DX: Parkinson's disease: G20

## 2019-08-04 ENCOUNTER — Other Ambulatory Visit: Payer: Self-pay

## 2019-08-04 ENCOUNTER — Encounter: Payer: Self-pay | Admitting: Neurology

## 2019-08-04 ENCOUNTER — Ambulatory Visit: Payer: BC Managed Care – PPO | Admitting: Neurology

## 2019-08-04 VITALS — BP 134/84 | HR 83 | Ht 68.0 in | Wt 205.0 lb

## 2019-08-04 DIAGNOSIS — G20 Parkinson's disease: Secondary | ICD-10-CM

## 2019-08-04 NOTE — Patient Instructions (Addendum)
I think you have signs and symptoms of parkinson's like changes, possible early signs of parkinson's disease.  I do want to suggest a few things today:  We will monitor your exam and discuss medication options.   For now, I would like to proceed with a DaT scan: This is a specialized brain scan designed to help with diagnosis of tremor disorders. A radioactive marker gets injected and the uptake is measured in the brain and compared to normal controls and right side is compared to the left, a change in uptake can help with diagnosis of certain tremor disorders. A brain MRI on the other hand is a brain scan that helps look at the brain structure in more detail overall and look for age-related changes, blood vessel related changes and look for stroke and volume loss which we call atrophy.   Remember to drink plenty of fluid at least 6 glasses (8 oz each), eat healthy meals and do not skip any meals. Try to eat protein with a every meal and eat a healthy snack such as fruit or nuts in between meals. Try to keep a regular sleep-wake schedule and try to exercise daily, particularly in the form of walking, 20-30 minutes a day, if you can.   Please work on smoking cessation and limit your alcohol intake to less than or up to one per day.   I would like to see you back after the scan.   Our phone number is [phone]. We also have an after hours call service for urgent matters and there is a physician on-call for urgent questions, that cannot wait till the next work day. For any emergencies you know to call 911 or go to the nearest emergency room.   You can email me through my chart and also leave a phone message for Aundra Millet, my nurse.

## 2019-08-04 NOTE — Progress Notes (Signed)
Subjective:    Patient ID: Wesley [name]. is a 59 y.o. male.  HPI     [name], MD, PhD [facility] [address], [address] [address] [place], [place] [address]  Dear [name],   I saw your patient, Wesley [name], upon your kind request, in my Neurologic clinic today for initial consultation of his tremors.  The patient is unaccompanied today.  As you know, [name] is a 59 year old right-handed gentleman with an underlying medical history of hyperlipidemia, reflux disease, left hand pain and obesity, who reports an intermittent tremor affecting his left hand.  I reviewed your office note from 06/21/2019.  He has seen his primary care physician.  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 He has had an intermittent tremor for about 6 months and aching sensation and stiffness in the left hand.  He denies any recent falls.  He does not have any tremor in the lower body.  He does not have a family history of tremors or Parkinson's disease.  He had an x-ray of the hand and wrist was tried on Mobic which did not provide any significant relief and also had a steroid shot.  He reports that his primary care physician told him it is not Parkinson's disease.  The patient reports some issue with his balance from time to time, no obvious changes in his walking or posture.  His wife has not noticed any obvious changes that she has mentioned.  He denies any sudden onset of one-sided weakness or numbness or tingling or droopy face or slurring of speech.  He feels that perhaps in the past 6 months he has had some more fatigue.  He works full-time as a Medical sales representative.  He smokes cigarettes daily, less than 1 pack/day.  He drinks alcohol daily about 2 glasses of wine per day.  He has never had a brain MRI or CT scan.    His Past Medical History Is Significant For: Past Medical History:  Diagnosis Date  . Acid reflux    occasional; no current med.  . 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  Hyperlipemia     His Past Surgical History Is Significant For: Past Surgical History:  Procedure Laterality Date  . EXAMINATION UNDER ANESTHESIA  07/16/2011   Procedure: EXAM UNDER ANESTHESIA;  Surgeon: Maisie Fus A. Cornett, MD;  Location: [facility];  Service: General;  Laterality: N/A;  . EXAMINATION UNDER ANESTHESIA N/A 09/30/2012   Procedure: EXAM UNDER ANESTHESIA;  Surgeon: Romie Levee, MD;  Location: [facility];  Service: General;  Laterality: N/A;  . HIGH RESOLUTION ANOSCOPY N/A 09/30/2012   Procedure: HIGH RESOLUTION ANOSCOPY;  Surgeon: Romie Levee, MD;  Location: [facility];  Service: General;  Laterality: N/A;  . INGUINAL HERNIA REPAIR  1980'S  . LASER ABLATION CONDOLAMATA N/A 09/30/2012   Procedure: LASER ABLATION CONDOLAMATA;  Surgeon: Romie Levee, MD;  [facility];  Service: General;  Laterality: N/A;  . WART FULGURATION  07/16/2011   Procedure: FULGURATION ANAL WART;  Surgeon: Clovis Pu. Cornett, MD;  Location: [facility];  Service: General;  Laterality: N/A;    His Family History Is Significant For: Family History  Problem Relation Age of Onset  . Memory loss Mother   . Heart disease Father     His Social History Is Significant For: Social History   Socioeconomic History  . Marital status: Married    Spouse name: Not  on file  . Number of children: Not on file  . Years of education: Not on file  . Highest education level: Not on file  Occupational History  . Not on file  Tobacco Use  . Smoking status: Current Every Day Smoker    Packs/day: 1.00    Years: 34.00    Pack years: 34.00    Types: Cigarettes  . Smokeless tobacco: Never Used  . Tobacco comment: Counseled on reduce to smoke  Substance and Sexual Activity  . Alcohol use: Yes    Alcohol/week: 14.0 standard drinks    Types: 14 Glasses of wine per week    Comment: 2 WINE PER DAY  . Drug use: No  . Sexual activity:  Not on file  Other Topics Concern  . Not on file  Social History Narrative  . Not on file   Social Determinants of Health   Financial Resource Strain:   . Difficulty of Paying Living Expenses:   Food Insecurity:   . Worried About Programme researcher, broadcasting/film/video in the Last Year:   . Barista in the Last Year:   Transportation Needs:   . Freight forwarder (Medical):   Marland Kitchen Lack of Transportation (Non-Medical):   Physical Activity:   . Days of Exercise per Week:   . Minutes of Exercise per Session:   Stress:   . Feeling of Stress :   Social Connections:   . Frequency of Communication with Friends and Family:   . Frequency of Social Gatherings with Friends and Family:   . Attends Religious Services:   . Active Member of Clubs or Organizations:   . Attends Banker Meetings:   Marland Kitchen Marital Status:     His Allergies Are:  No Known Allergies:   His Current Medications Are:  Outpatient Encounter Medications as of 08/04/2019  Medication Sig  . simvastatin (ZOCOR) 40 MG tablet Take 40 mg by mouth daily. AM  . [DISCONTINUED] ciprofloxacin (CIPRO) 500 MG tablet Take 500 mg by mouth 2 (two) times daily.  . [DISCONTINUED] ibuprofen (ADVIL,MOTRIN) 800 MG tablet Take 1 tablet (800 mg total) by mouth 3 (three) times daily. (Patient not taking: Reported on 11/27/2015)  . [DISCONTINUED] neomycin-polymyxin-hydrocortisone (CORTISPORIN) 3.5-10000-1 otic suspension Place 4 drops into the left ear 4 (four) times daily. X 7 days (Patient not taking: Reported on 11/27/2015)  . [DISCONTINUED] OTOVEL 0.3-0.025 % SOLN Place 1-2 drops into the left ear 2 (two) times daily.  . [DISCONTINUED] oxyCODONE-acetaminophen (PERCOCET/ROXICET) 5-325 MG tablet Take 2 tablets by mouth every 6 (six) hours as needed for severe pain.  . [DISCONTINUED] tamsulosin (FLOMAX) 0.4 MG CAPS capsule Take 1 capsule (0.4 mg total) by mouth daily.   No facility-administered encounter medications on file as of 08/04/2019.   :   Review of Systems:  Out of a complete 14 point review of systems, all are reviewed and negative with the exception of these symptoms as listed below:  Review of Systems  Neurological:       Here to discuss worsening left hand tremor.     Objective:  Neurological Exam  Physical Exam Physical Examination:   Vitals:   08/04/19 0959  BP: 134/84  Pulse: 83   General Examination: The patient is a very pleasant 59 y.o. male in no acute distress. He appears well-developed and well-nourished and well groomed.   HEENT: Normocephalic, atraumatic, pupils are equal, round and reactive to light and accommodation.  Extraocular tracking is well preserved, no  nystagmus is seen.  Maybe slight decrease in upgaze.  He has perhaps mild facial masking, mild nuchal rigidity is seen, no obvious hypophonia, dysarthria, or lip, neck or jaw tremor.  Airway examination reveals mild to moderate mouth dryness, tongue protrudes centrally and palate elevates symmetrically.  He has intact hearing.    Chest: Clear to auscultation without wheezing, rhonchi or crackles noted.  Heart: S1+S2+0, regular and normal without murmurs, rubs or gallops noted.   Abdomen: Soft, non-tender and non-distended with normal bowel sounds appreciated on auscultation.  Extremities: There is no pitting edema in the distal lower extremities bilaterally.   Skin: Warm and dry without trophic changes noted.  Musculoskeletal: exam reveals no obvious joint deformities, tenderness or joint swelling or erythema.   Neurologically:  Mental status: The patient is awake, alert and oriented in all 4 spheres. His immediate and remote memory, attention, language skills and fund of knowledge are appropriate. There is no evidence of aphasia, agnosia, apraxia or anomia. Speech is clear with normal prosody and enunciation. Thought process is linear. Mood is normal and affect is normal.  He does become upset and nearly tearful during our  appointment.  On Archimedes spiral drawing he has insecurity with his nondominant hand but no obvious trembling with either hand.  Handwriting is legible, not particularly micrographic.  He has a fairly consistent left hand resting tremor.  He has no resting tremor in the right upper extremity or lower extremities.  He has a slight postural tremor in the left more than right upper extremity, no significant action tremor.   Cranial nerves II - XII are as described above under HEENT exam. In addition: shoulder shrug is normal, but at baseline, right shoulder is slightly higher than left. Motor exam: Normal bulk, and strength is noted, tone is slightly increased in the left upper extremity especially in the wrist area.  Reflexes are symmetrical.  Fine motor skills and coordination: He has mild to moderate impairment with finger taps on the left side, normal on the right, hand movements are mildly impaired on the left and fairly normal on the right, rapid alternating patting is mildly impaired on the left, normal on the right, foot agility is mildly impaired on the left and normal on the right, foot taps are also mildly impaired on the left and normal on the right.  He stands without difficulty, posture is mildly stooped for age, he walks with fairly good stride length and pace but decreased arm swing on the left.   Cerebellar testing: No dysmetria or intention tremor on finger to nose testing. Heel to shin is unremarkable bilaterally. There is no truncal or gait ataxia.  Sensory exam: intact to light touch.   Assessment and Plan:   In summary, Keefe Zawistowski. is a very pleasant 59 y.o.-year old male with an underlying medical history of hyperlipidemia, reflux disease, left hand pain and obesity, who presents for evaluation of his left hand tremor of approximately 6 months duration.  He reports a intermittent tremor, stiffness and aching sensation in the left hand.  He has also noticed some increase in  fatigue in the past 6 months or so.  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  The patient was advised that there is no single definitive test for this condition.  We talked about the presentation, and potentially utilizing symptomatic treatment options in the near future.  For diagnostic  help since he is younger I would like to proceed with a DaTscan.  I explained this test in detail to him, written instructions were provided as well as additional information and written informed consent was obtained today.  The patient was upset at this diagnosis or diagnostic possibility and became even tearful at 1 point, he wanted to call his wife to have her come to the appointment but since she was not physically here, I suggested that we have her joint for the next appointment which we will plan soon after his DaTscan.  We will also call him with the results.  We talked about the importance of healthy lifestyle and physical activity.  He is advised to stay well-hydrated, well rested, exercise on a regular basis within his limitations and possibilities.  He is furthermore advised to quit smoking and limit his alcohol intake and try to avoid daily alcohol consumption or perhaps limit himself to up to 1 serving per day.  He is also advised that he could seek a second opinion with another movement disorder specialist.  For now, he is agreeable to proceeding with a DaTscan and we will talk about utilizing medication options in the near future and also call him with the results and to set up a follow-up appointment soon.   Thank you very much for allowing me to participate in the care of this nice patient. If I can be of any further assistance to you please do not hesitate to call me at [phone].  Sincerely,   [name], MD, PhD

## 2019-08-10 ENCOUNTER — Telehealth: Payer: Self-pay | Admitting: Neurology

## 2019-08-10 NOTE — Telephone Encounter (Signed)
Pt is asking for a call re: the scheduling of his DAT scan.

## 2019-08-15 NOTE — Telephone Encounter (Signed)
I have talked to patient and relayed DAT scan is still pending .

## 2019-08-17 NOTE — Telephone Encounter (Signed)
I have talked to patient and he is scheduled

## 2019-08-31 ENCOUNTER — Other Ambulatory Visit: Payer: Self-pay

## 2019-08-31 ENCOUNTER — Encounter (HOSPITAL_COMMUNITY)
Admission: RE | Admit: 2019-08-31 | Discharge: 2019-08-31 | Disposition: A | Discharge status: Home / Self Care | Payer: BC Managed Care – PPO | Source: Ambulatory Visit | Attending: Neurology | Admitting: Neurology

## 2019-08-31 DIAGNOSIS — G20C Parkinsonism, unspecified: Secondary | ICD-10-CM

## 2019-08-31 DIAGNOSIS — G20 Parkinson's disease: Secondary | ICD-10-CM | POA: Diagnosis not present

## 2019-08-31 MED ORDER — IOFLUPANE I 123 185 MBQ/2.5ML IV SOLN
4.3500 | Freq: Once | INTRAVENOUS | Status: AC | PRN
  Administered 2019-08-31: 4.35 via INTRAVENOUS
  Filled 2019-08-31: qty 5

## 2019-08-31 MED ORDER — IODINE STRONG (LUGOLS) 5 % PO SOLN
ORAL | Status: AC
  Filled 2019-08-31: qty 1

## 2019-08-31 MED ORDER — IODINE STRONG (LUGOLS) 5 % PO SOLN
0.8000 mL | Freq: Once | ORAL | Status: AC
  Administered 2019-08-31: 0.8 mL via ORAL

## 2019-09-01 NOTE — Telephone Encounter (Signed)
Please advise patient that the DaTscan results are not available yet.

## 2019-09-01 NOTE — Telephone Encounter (Signed)
Per pt's request when I previously talked with him.  Pt asked I call him back around 4 pm to give update on if DAT scan were back. I called pt was not available and vm was full not accepting new message.. Pt's result are not available as of yet... I will try one more time before our office closes today to advise.

## 2019-09-01 NOTE — Telephone Encounter (Signed)
Patient would like to know the results of his DAT Scan and he didn't want to wait until Tuesday to get the results.

## 2019-09-01 NOTE — Telephone Encounter (Signed)
Pt notified of Dr. Teofilo Pod response.

## 2019-09-01 NOTE — Telephone Encounter (Signed)
Called pt again and was able to advise result are still not resulted. I assured pt I would call as soon as they are available.

## 2019-09-02 DIAGNOSIS — G20 Parkinson's disease: Secondary | ICD-10-CM | POA: Diagnosis not present

## 2019-09-06 ENCOUNTER — Telehealth: Payer: Self-pay

## 2019-09-06 NOTE — Telephone Encounter (Signed)
Received a call report from Diane with [facility] radiology stating pt DAT scan report was completed on Friday ( office was closed on 7/2 and 7/5). I advised Diane Dr. Frances Furbish had received results and completed a result note on 09/06/2019 at 7 am.  She verbalized understanding and had no other questions/concerns.

## 2019-09-06 NOTE — Telephone Encounter (Signed)
I called the pt and was able to advise on the results of the dat scan.  Pt verbalized understanding and has been scheduled for tomorrow at 830 am.

## 2019-09-06 NOTE — Progress Notes (Signed)
Please call pt regarding the recent DaT scan: the radioactive marker uptake pattern was in keeping with parkinsonism, and supports the diagnosis of Parkinson's disease, or an underlying parkinsonian syndrome.  I would like to discuss medication options with him.  Please offer follow-up appointment. [name], MD, PhD [facility])

## 2019-09-07 ENCOUNTER — Ambulatory Visit: Payer: BC Managed Care – PPO | Admitting: Neurology

## 2019-09-07 ENCOUNTER — Encounter: Payer: Self-pay | Admitting: Neurology

## 2019-09-07 VITALS — BP 118/79 | HR 68 | Ht 68.0 in | Wt 207.0 lb

## 2019-09-07 DIAGNOSIS — R400 Somnolence: Secondary | ICD-10-CM

## 2019-09-07 DIAGNOSIS — G20 Parkinson's disease: Secondary | ICD-10-CM

## 2019-09-07 MED ORDER — ROPINIROLE HCL 0.25 MG PO TABS: ORAL_TABLET | ORAL | 5 refills | Status: DC

## 2019-09-07 NOTE — Progress Notes (Signed)
Subjective:    Patient ID: Wesley [name]. is a 59 y.o. male.  HPI     Interim history:   Mr. Shughart is a 59 year old right-handed gentleman with an underlying medical history of hyperlipidemia, reflux disease, left hand pain and obesity, who Presents for follow-up consultation of his left hand tremor and concern for parkinsonism.  The patient is accompanied by his wife today.  I first met him on 08/04/2019, at the request of Marella Chimes, PA in rheumatology, at which time the patient reported an approximately 82-monthhistory of tremors affecting his left hand and stiffness and pain in the left wrist.  His findings were concerning for left-sided parkinsonism.  He was advised to proceed with a DaTscan for better diagnostic clarity.   He had a DaT scan on 08/31/19 and I reviewed the results: IMPRESSION: Loss of activity in the posterior striata and asymmetric decreased activity in the head of the RIGHT caudate nucleus. This pattern can be seen in Parkinsonian syndromes.   Of note, DaTSCAN is not diagnostic of Parkinsonian syndromes, which remains a clinical diagnosis. DaTscan is an adjuvant test to aid in the clinical diagnosis of Parkinsonian syndromes.  We called him with the test result and arranged a FU appointment.   Today, 09/07/19: he reports no new symptoms, no recent falls, sleeps well at night but does report some daytime somnolence.  Per wife, he snores occasionally.  He denies any cognitive symptoms such as forgetfulness and has not had any mood related issues such as depression or anxiety.  He has cut back on his smoking, smokes a few cigarettes per day.  He does drink wine daily, at least 2 glasses on an average day.  He has not yet cut back.  The patient's allergies, current medications, family history, past medical history, past social history, past surgical history and problem list were reviewed and updated as appropriate.   Previously:   08/04/19: (He) reports an intermittent  tremor affecting his left hand.  I reviewed your office note from 06/21/2019.   He has seen his primary care physician.  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 He has had an intermittent tremor for about 6 months and aching sensation and stiffness in the left hand.  He denies any recent falls.  He does not have any tremor in the lower body.  He does not have a family history of tremors or Parkinson's disease.  He had an x-ray of the hand and wrist was tried on Mobic which did not provide any significant relief and also had a steroid shot.  He reports that his primary care physician told him it is not Parkinson's disease.  The patient reports some issue with his balance from time to time, no obvious changes in his walking or posture.  His wife has not noticed any obvious changes that she has mentioned.  He denies any sudden onset of one-sided weakness or numbness or tingling or droopy face or slurring of speech.  He feels that perhaps in the past 6 months he has had some more fatigue.  He works full-time as a lNurse, adult  He smokes cigarettes daily, less than 1 pack/day.  He drinks alcohol daily about 2 glasses of wine per day.  He has never had a brain MRI or CT scan.    His Past Medical History Is Significant For: Past Medical History:  Diagnosis Date  . Acid reflux    occasional; no current med.  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 Hyperlipemia     His Past Surgical History Is Significant For: Past Surgical History:  Procedure Laterality Date  . EXAMINATION UNDER ANESTHESIA  07/16/2011   Procedure: EXAM UNDER ANESTHESIA;  Surgeon: Marcello Moores A. Cornett, MD;  Location: [place];  Service: General;  Laterality: N/A;  . EXAMINATION UNDER ANESTHESIA N/A 09/30/2012   Procedure: EXAM UNDER ANESTHESIA;  Surgeon: Leighton Ruff, MD;  Location: [place];  Service: General;  Laterality: N/A;  . HIGH RESOLUTION ANOSCOPY N/A 09/30/2012   Procedure: HIGH RESOLUTION  ANOSCOPY;  Surgeon: Leighton Ruff, MD;  Location: [place];  Service: General;  Laterality: N/A;  . INGUINAL HERNIA REPAIR  1980'S  . LASER ABLATION CONDOLAMATA N/A 09/30/2012   Procedure: LASER ABLATION CONDOLAMATA;  Surgeon: Leighton Ruff, MD;  [facility];  Service: General;  Laterality: N/A;  . WART FULGURATION  07/16/2011   Procedure: FULGURATION ANAL WART;  Surgeon: Joyice Faster. Cornett, MD;  Location: [place];  Service: General;  Laterality: N/A;    His Family History Is Significant For: Family History  Problem Relation Age of Onset  . Memory loss Mother   . Heart disease Father     His Social History Is Significant For: Social History   Socioeconomic History  . Marital status: Married    Spouse name: Not on file  . Number of children: Not on file  . Years of education: Not on file  . Highest education level: Not on file  Occupational History  . Not on file  Tobacco Use  . Smoking status: Current Every Day Smoker    Packs/day: 1.00    Years: 34.00    Pack years: 34.00    Types: Cigarettes  . Smokeless tobacco: Never Used  . Tobacco comment: Counseled on reduce to smoke  Substance and Sexual Activity  . Alcohol use: Yes    Alcohol/week: 14.0 standard drinks    Types: 14 Glasses of wine per week    Comment: 2 WINE PER DAY  . Drug use: No  . Sexual activity: Not on file  Other Topics Concern  . Not on file  Social History Narrative  . Not on file   Social Determinants of Health   Financial Resource Strain:   . Difficulty of Paying Living Expenses:   Food Insecurity:   . Worried About Charity fundraiser in the Last Year:   . Arboriculturist in the Last Year:   Transportation Needs:   . Film/video editor (Medical):   Marland Kitchen Lack of Transportation (Non-Medical):   Physical Activity:   . Days of Exercise per Week:   . Minutes of Exercise per Session:   Stress:   . Feeling of Stress :   Social  Connections:   . Frequency of Communication with Friends and Family:   . Frequency of Social Gatherings with Friends and Family:   . Attends Religious Services:   . Active Member of Clubs or Organizations:   . Attends Archivist Meetings:   Marland Kitchen Marital Status:     His Allergies Are:  No Known Allergies:   His Current Medications Are:  Outpatient Encounter Medications as of 09/07/2019  Medication Sig  . simvastatin (ZOCOR) 40 MG tablet Take 40 mg by mouth daily. AM   No facility-administered encounter medications on file as of 09/07/2019.  :  Review of Systems:  Out of a complete 14 point review of  systems, all are reviewed and negative with the exception of these symptoms as listed below: Review of Systems  Neurological:       Here for f/u on dat scan and to discuss meds.    Objective:  Neurological Exam  Physical Exam Physical Examination:   Vitals:   09/07/19 0839  BP: 118/79  Pulse: 68    General Examination: The patient is a very pleasant 59 y.o. male in no acute distress. He appears well-developed and well-nourished and well groomed.   HEENT: Normocephalic, atraumatic, pupils are equal, round and reactive to light and accommodation.  Extraocular tracking is well preserved, no nystagmus is seen.  Maybe slight decrease in upgaze.  He has mild facial masking, mild nuchal rigidity, no significant hypophonia or dysarthria or jaw tremor or voice tremor.  He has mild to moderate mouth dryness, mild airway crowding, slightly thicker saliva, no obvious sialorrhea, tongue protrudes centrally and palate elevates symmetrically.  Hearing is grossly intact.  Chest: Clear to auscultation without wheezing, rhonchi or crackles noted.  Heart: S1+S2+0, regular and normal without murmurs, rubs or gallops noted.   Abdomen: Soft, non-tender and non-distended with normal bowel sounds appreciated on auscultation.  Extremities: There is no pitting edema in the distal lower  extremities bilaterally.   Skin: Warm and dry without trophic changes noted.  Musculoskeletal: exam reveals no obvious joint deformities, tenderness or joint swelling or erythema.   Neurologically:  Mental status: The patient is awake, alert and oriented in all 4 spheres. His immediate and remote memory, attention, language skills and fund of knowledge are appropriate. There is no evidence of aphasia, agnosia, apraxia or anomia. Speech is clear with normal prosody and enunciation. Thought process is linear. Mood is normal and affect is normal.    (On Archimedes spiral drawing he has insecurity with his nondominant hand but no obvious trembling with either hand. Handwriting is legible, not particularly micrographic.)  He has a fairly consistent left hand resting tremor.  He has no resting tremor in the right upper extremity or lower extremities. He has a slight postural tremor in the left more than right upper extremity, no significant action tremor.   Cranial nerves II - XII are as described above under HEENT exam. In addition: shoulder shrug is normal, but at baseline, right shoulder is slightly higher than left. Motor exam: Normal bulk, and strength is noted, tone is slightly increased in the left upper extremity especially in the wrist area. Fine motor skills and coordination: He has mild to moderate impairment with finger taps on the left side, normal on the right, hand movements are mildly impaired on the left and fairly normal on the right, foot agility is minimally impaired on the left and normal on the right.  He stands without difficulty, posture is mildly stooped for age, he walks with fairly good stride length and pace but decreased arm swing on the left.   Cerebellar testing: No dysmetria or intention tremor on finger to nose testing. Heel to shin is unremarkable bilaterally. There is no truncal or gait ataxia.  Sensory exam: intact to light touch.   Assessment and Plan:   In  summary, Lorris Carducci. is a very pleasant 59 y.o.-year old male with an underlying medical history of hyperlipidemia, reflux disease, left hand pain and obesity, who presents for follow-up consultation of his left hand dysfunction including tremor and pain and stiffness of at least 7 months' duration.  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  Findings are mild and fairly stable from our first visit.  We had a long discussion about his symptoms and presentation, test results and symptomatic treatment options.  I also explained the importance of healthy lifestyle.  He is advised to scale back on his daily alcohol consumption and limit himself to 1 or 2 drinks/servings per day at the most.  He is furthermore advised to stay well-hydrated with water, well rested.  He is advised to consider coming in for a sleep study to rule out obstructive sleep apnea as he does report some daytime somnolence and is noted to snore some.  He is furthermore advised to continue to work on smoking cessation completely.  He is encouraged to try medication for symptomatic treatment of his symptoms particularly his left upper extremity symptoms of tremor and stiffness.  To that end, I recommend a dopamine agonist.  We talked about these medications, expectations, benefits and potential side effects including dopamine dysregulation syndrome.  All questions were answered.  He is agreeable to starting ropinirole 0.25 mg strength with gradual increase to 0.75 mg 3 times daily.  I plan to see him back in 3 months, sooner if needed.  I answered all their questions today and the patient and his wife were in agreement.   I spent 45 minutes in total face-to-face time and in reviewing records during pre-charting, more than 50% of which was spent in counseling and coordination of care, reviewing test results, reviewing medications and treatment regimen  and/or in discussing or reviewing the diagnosis of PD, the prognosis and treatment options. Pertinent laboratory and imaging test results that were available during this visit with the patient were reviewed by me and considered in my medical decision making (see chart for details).

## 2019-09-07 NOTE — Patient Instructions (Signed)
It was nice to see you again today and meet your wife as well.  As explained, your recent DaTscan result would support the diagnosis of Parkinson's disease.    As discussed, we will start you on a medication for Parkinson's disease, the medication is called Requip (generic name: ropinirole) 0.25 mg: Take one pill twice daily (morning and lunchtime) for one week, then one pill 3 times a day (morning, lunch and evening) for one week, then 2 pills 3 times a day for one week, then 3 pills three times a day thereafter.   Common side effects reported are: Sedation, sleepiness, nausea, vomiting, and rare side effects are confusion, hallucinations, swelling in legs, and abnormal behaviors, including impulse control problems, which can manifest as excessive eating, obsessions with food or gambling, or hypersexuality.  For your daytime sleepiness and history of snoring we can consider doing a sleep study down the road.  Please think about it.  Underlying sleep apnea may be the cause for your daytime sleepiness and can be treated with a machine called CPAP or AutoPap typically.  We do testing right here in our clinic.

## 2019-10-04 DIAGNOSIS — Z20822 Contact with and (suspected) exposure to covid-19: Secondary | ICD-10-CM | POA: Diagnosis not present

## 2019-10-24 ENCOUNTER — Telehealth: Payer: Self-pay | Admitting: Neurology

## 2019-10-24 MED ORDER — ROPINIROLE HCL 1 MG PO TABS: 1.0000 mg | ORAL_TABLET | Freq: Three times a day (TID) | ORAL | 5 refills | Status: DC

## 2019-10-24 NOTE — Telephone Encounter (Signed)
I attempted to reach the pt to review Dr. Teofilo Pod recommendation. Pt was not available and vm was full. Will try again at a later time.

## 2019-10-24 NOTE — Telephone Encounter (Signed)
Please advise patient that since he has not had any telltale response to the starting dose of Requip I would like for him to increase it to 1 mg strength, take 1 pill 3 times a day, which is a mild increase from his 0.75 mg 3 times daily currently.  We will likely increase it further after that but I think changing it to 1 mg strength would help to not have to take too many pills.

## 2019-10-24 NOTE — Telephone Encounter (Signed)
Patient called today and states that the Ropinirole doesn't seem to be working for his tremors and he is having some side effects. Please advise.

## 2019-10-24 NOTE — Addendum Note (Signed)
Addended by: [name] on: 10/24/2019 04:14 PM   Modules accepted: Orders

## 2019-10-24 NOTE — Telephone Encounter (Signed)
Attempted to reach the pt. Pt's vm was not accepting new messages at the time of the call. Will try to call back at a later time.

## 2019-10-24 NOTE — Telephone Encounter (Signed)
I called the pt back. He states he has successfully titrated up to the 3 pills 3 times per day, he reports he has been taking this dosage for the last 2-3 weeks.  He sts since taking this dosage he has not seen improvement in his tremors and reports feeling jittery, restless and unable to relax through out the day.   Pt wanted to know if Dr. Frances Furbish had any other recommendations at this point?

## 2019-10-25 ENCOUNTER — Telehealth: Payer: Self-pay | Admitting: Neurology

## 2019-10-25 NOTE — Telephone Encounter (Signed)
Pt called to return phone call

## 2019-10-25 NOTE — Telephone Encounter (Signed)
I attempted to reach the pt to review Dr. Teofilo Pod recommendations. Pt was not available and vm was full at the time of call.

## 2019-10-26 NOTE — Telephone Encounter (Signed)
I reached out to the pt and we discussed Dr. Teofilo Pod recommendation. He was agreeable to trying the requip 1 mg tabs 1 tab tid.  PT confirmed he has already pick the medication up and will start.  He will call and report how he is doing in 1-2 weeks.

## 2019-11-27 DIAGNOSIS — Z20822 Contact with and (suspected) exposure to covid-19: Secondary | ICD-10-CM | POA: Diagnosis not present

## 2019-12-08 ENCOUNTER — Encounter: Payer: Self-pay | Admitting: Neurology

## 2019-12-08 ENCOUNTER — Ambulatory Visit (INDEPENDENT_AMBULATORY_CARE_PROVIDER_SITE_OTHER): Payer: BC Managed Care – PPO | Admitting: Neurology

## 2019-12-08 VITALS — BP 136/82 | HR 70 | Ht 68.0 in | Wt 216.0 lb

## 2019-12-08 DIAGNOSIS — Z0189 Encounter for other specified special examinations: Secondary | ICD-10-CM | POA: Diagnosis not present

## 2019-12-08 DIAGNOSIS — G20 Parkinson's disease: Secondary | ICD-10-CM

## 2019-12-08 DIAGNOSIS — Z23 Encounter for immunization: Secondary | ICD-10-CM | POA: Diagnosis not present

## 2019-12-08 MED ORDER — ROPINIROLE HCL 1 MG PO TABS: 2.0000 mg | ORAL_TABLET | Freq: Three times a day (TID) | ORAL | 3 refills | Status: DC

## 2019-12-08 NOTE — Progress Notes (Signed)
Subjective:    Patient ID: Wesley O Scheff Jr. is a 58 y.o. male.  HPI     Interim history:  Wesley [name] is a 58-year-old right-handed gentleman with an underlying medical history of hyperlipidemia, reflux disease, left hand pain and obesity, who presents for follow-up consultation of his left hand tremor and concern for parkinsonism, possible L sided PD.  The patient is unaccompanied today.  I saw him on 09/07/2019, at which time he felt stable.  He was working on smoking cessation. He was also advised to cut back on his daily alcohol consumption. He was advised regarding his DaTscan results and encouraged to start Requip low-dose with gradual titration.  He reached out in the interim in August 2021 and reported that he had not noticed any telltale response.  He felt jittery and restless and was advised to increase the Requip to 1 mg 3 times daily.  Today, 12/08/2019: He reports feeling about the same. Has had some intermittent nausea, otherwise tolerates the Requip at 1 mg 3 times daily. He has not noticed any telltale results from it, in particular, he does not believe the tremor is any better. He has not fallen. He tries to hydrate well with water. He tries to stay active, was able to go on a hiking trip this past weekend without any problem. He denies any speech or swallowing issues or sleep issues at this time.  The patient's allergies, current medications, family history, past medical history, past social history, past surgical history and problem list were reviewed and updated as appropriate.    Previously:   I first met him on 08/04/2019, at the request of Erin Gray, PA in rheumatology, at which time the patient reported an approximately 6-month history of tremors affecting his left hand and stiffness and pain in the left wrist.  His findings were concerning for left-sided parkinsonism.  He was advised to proceed with a DaTscan for better diagnostic clarity.    He had a DaT scan on 08/31/19 and I  reviewed the results: IMPRESSION: Loss of activity in the posterior striata and asymmetric decreased activity in the head of the RIGHT caudate nucleus. This pattern can be seen in Parkinsonian syndromes.   Of note, DaTSCAN is not diagnostic of Parkinsonian syndromes, which remains a clinical diagnosis. DaTscan is an adjuvant test to aid in the clinical diagnosis of Parkinsonian syndromes.   We called him with the test result and arranged a FU appointment.      08/04/19: (He) reports an intermittent tremor affecting his left hand.  I reviewed your office note from 06/21/2019.   He has seen his primary care physician.  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 He has had an intermittent tremor for about 6 months and aching sensation and stiffness in the left hand.  He denies any recent falls.  He does not have any tremor in the lower body.  He does not have a family history of tremors or Parkinson's disease.  He had an x-ray of the hand and wrist was tried on Mobic which did not provide any significant relief and also had a steroid shot.  He reports that his primary care physician told him it is not Parkinson's disease.  The patient reports some issue with his balance from time to time, no obvious changes in his walking or posture.  His wife has not noticed any obvious changes that she has mentioned.  He denies any sudden onset of one-sided weakness or   numbness or tingling or droopy face or slurring of speech.  He feels that perhaps in the past 6 months he has had some more fatigue.  He works full-time as a Nurse, adult.  He smokes cigarettes daily, less than 1 pack/day.  He drinks alcohol daily about 2 glasses of wine per day.  He has never had a brain MRI or CT scan.    His Past Medical History Is Significant For: Past Medical History:  Diagnosis Date  . Acid reflux    occasional; no current med.  . 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 Hyperlipemia     His Past Surgical History Is Significant  For: Past Surgical History:  Procedure Laterality Date  . EXAMINATION UNDER ANESTHESIA  07/16/2011   Procedure: EXAM UNDER ANESTHESIA;  Surgeon: Marcello Moores A. Cornett, MD;  Location: [place];  Service: General;  Laterality: N/A;  . EXAMINATION UNDER ANESTHESIA N/A 09/30/2012   Procedure: EXAM UNDER ANESTHESIA;  Surgeon: Leighton Ruff, MD;  Location: [place];  Service: General;  Laterality: N/A;  . HIGH RESOLUTION ANOSCOPY N/A 09/30/2012   Procedure: HIGH RESOLUTION ANOSCOPY;  Surgeon: Leighton Ruff, MD;  Location: [place];  Service: General;  Laterality: N/A;  . INGUINAL HERNIA REPAIR  1980'S  . LASER ABLATION CONDOLAMATA N/A 09/30/2012   Procedure: LASER ABLATION CONDOLAMATA;  Surgeon: Leighton Ruff, MD;  Location: [facility];  Service: General;  Laterality: N/A;  . WART FULGURATION  07/16/2011   Procedure: FULGURATION ANAL WART;  Surgeon: Joyice Faster. Cornett, MD;  Location: [place];  Service: General;  Laterality: N/A;    His Family History Is Significant For: Family History  Problem Relation Age of Onset  . Memory loss Mother   . Heart disease Father     His Social History Is Significant For: Social History   Socioeconomic History  . Marital status: Married    Spouse name: Not on file  . Number of children: Not on file  . Years of education: Not on file  . Highest education level: Not on file  Occupational History  . Not on file  Tobacco Use  . Smoking status: Current Every Day Smoker    Packs/day: 1.00    Years: 34.00    Pack years: 34.00    Types: Cigarettes  . Smokeless tobacco: Never Used  . Tobacco comment: Counseled on reduce to smoke  Substance and Sexual Activity  . Alcohol use: Yes    Alcohol/week: 14.0 standard drinks    Types: 14 Glasses of wine per week    Comment: 2 WINE PER DAY  . Drug use: No  . Sexual activity: Not on file  Other Topics Concern  . Not on file  Social  History Narrative  . Not on file   Social Determinants of Health   Financial Resource Strain:   . Difficulty of Paying Living Expenses: Not on file  Food Insecurity:   . Worried About Charity fundraiser in the Last Year: Not on file  . Ran Out of Food in the Last Year: Not on file  Transportation Needs:   . Lack of Transportation (Medical): Not on file  . Lack of Transportation (Non-Medical): Not on file  Physical Activity:   . Days of Exercise per Week: Not on file  . Minutes of Exercise per Session: Not on file  Stress:   . Feeling of Stress : Not on file  Social Connections:   . Frequency of  Communication with Friends and Family: Not on file  . Frequency of Social Gatherings with Friends and Family: Not on file  . Attends Religious Services: Not on file  . Active Member of Clubs or Organizations: Not on file  . Attends Archivist Meetings: Not on file  . Marital Status: Not on file    His Allergies Are:  No Known Allergies:   His Current Medications Are:  Outpatient Encounter Medications as of 12/08/2019  Medication Sig  . rOPINIRole (REQUIP) 1 MG tablet Take 1 tablet (1 mg total) by mouth 3 (three) times daily. Take 1 pill 3 times daily  . simvastatin (ZOCOR) 40 MG tablet Take 40 mg by mouth daily. AM   No facility-administered encounter medications on file as of 12/08/2019.  :  Review of Systems:  Out of a complete 14 point review of systems, all are reviewed and negative with the exception of these symptoms as listed below:  Review of Systems  Neurological:       Here for 6 month f/u.  Pt reports tremor has not improved since his last visit.     Objective:  Neurological Exam  Physical Exam Physical Examination:   Vitals:   12/08/19 0834  BP: 136/82  Pulse: 70  SpO2: 96%    General Examination: The patient is a very pleasant 58 y.o. male in no acute distress. He appears well-developed and well-nourished and well groomed.    HEENT:Normocephalic, atraumatic, pupils are equal, round and reactive to light, tracking is well preserved, no nystagmus is seen. He has mild facial masking, mild nuchal rigidity, no significant hypophonia or dysarthria or jaw tremor or voice tremor.  He has mild to mouth dryness, mild airway crowding, no obvious sialorrhea, tongue protrudes centrally and palate elevates symmetrically.  Hearing is grossly intact.  Chest:Clear to auscultation without wheezing, rhonchi or crackles noted.  Heart:S1+S2+0, regular and normal without murmurs, rubs or gallops noted.   Abdomen:Soft, non-tender and non-distended with normal bowel sounds appreciated on auscultation.  Extremities:There isnopitting edema in the distal lower extremities bilaterally.   Skin: Warm and dry without trophic changes noted.  Musculoskeletal: exam reveals no obvious joint deformities, tenderness or joint swelling or erythema.   Neurologically:  Mental status: The patient is awake, alert and oriented in all 4 spheres.Hisimmediate and remote memory, attention, language skills and fund of knowledge are appropriate. There is no evidence of aphasia, agnosia, apraxia or anomia. Speech is clear with normal prosody and enunciation. Thought process is linear. Mood is normaland affect is normal.  (On Archimedes spiral drawing he has insecurity with his nondominant hand but no obvious trembling with either hand. Handwriting is legible, not particularly micrographic.)  He has a fairly consistent left hand resting tremor. He has no resting tremor in the right upper extremity or lower extremities. He has a slight postural tremor in the left more than right upper extremity, no significant action tremor.  Cranial nerves II - XII are as described above under HEENT exam. In addition: shoulder shrug is normal, but at baseline, right shoulder is slightly higher than left. Motor exam: Normal bulk,and strength is noted, tone  is slightly increased in the left upper extremity. Fine motor skills and coordination:He has mild impairment with finger taps on the left side, normal on the right, hand movements are mildly impaired on the left and fairly normal on the right, foot agility is minimally impaired on the left and normal on the right. He stands without difficulty, posture is  mildly stooped for age, he walks with fairly good stride length and pace but decreased arm swing on the left.  Cerebellar testing: No dysmetria or intention tremor on finger to nose testing. Heel to shin is unremarkable bilaterally. There is no truncal or gait ataxia.  Sensory exam: intact to light touch.  Assessmentand Plan:   In summary,Wesley O Lapierre Jr.is a very pleasant 58-year oldmalewith an underlying medical history of hyperlipidemia, reflux disease, left hand pain and obesity, whopresents for follow-up consultation of his left hand dysfunction including tremor and pain and stiffness of at least 10 months' duration. 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 Findings are mild and tremor and fine motor exam are slightly improved from our last visit in July. He had started low-dose Requip after her last visit and is currently taking 1 mg 3 times daily. He has had some nausea intermittently but not severe. He is able to tolerate it otherwise and denies any severe sleepiness or any swelling from it. He denies any telltale symptom improvement or particularly tremor improvement from it but upon my examination today there is a slight improvement in his resting tremor and fine motor control in the left upper extremity. Nevertheless but, he is advised to increase the ropinirole at this time to 2 mg 3 times daily. We again discussed symptoms and presentation and expectations with the symptomatic treatment.  He was advised to continue to pursue healthy  lifestyle, good hydration, good nutrition, regular exercise, good sleep routine. He is advised to follow-up in about 3 months, sooner if needed. I answered all his questions today and he was in agreement. I spent 30 minutes in total face-to-face time and in reviewing records during pre-charting, more than 50% of which was spent in counseling and coordination of care, reviewing test results, reviewing medications and treatment regimen and/or in discussing or reviewing the diagnosis of PD, the prognosis and treatment options. Pertinent laboratory and imaging test results that were available during this visit with the patient were reviewed by me and considered in my medical decision making (see chart for details).     

## 2019-12-08 NOTE — Patient Instructions (Addendum)
It was good to see you again today.  Compared to last visit your exam is slightly improved on the left side with your fine motor control, tremor is slightly better in my perception.  As discussed, I would like for you to increase your ropinirole to 2 mg 3 times a day which should be 2 pills 3 times a day.  I renewed your prescription for the 1 mg strength ropinirole to reflect this change.  You can pick up your prescription at your convenience.  Please follow-up routinely in 3 months.  Please continue to pursue healthy lifestyle, good nutrition, good hydration, weight management, regular exercise.

## 2019-12-12 DIAGNOSIS — E785 Hyperlipidemia, unspecified: Secondary | ICD-10-CM | POA: Diagnosis not present

## 2019-12-12 DIAGNOSIS — Z713 Dietary counseling and surveillance: Secondary | ICD-10-CM | POA: Diagnosis not present

## 2019-12-12 DIAGNOSIS — Z043 Encounter for examination and observation following other accident: Secondary | ICD-10-CM | POA: Diagnosis not present

## 2019-12-23 DIAGNOSIS — E782 Mixed hyperlipidemia: Secondary | ICD-10-CM | POA: Diagnosis not present

## 2019-12-23 DIAGNOSIS — G479 Sleep disorder, unspecified: Secondary | ICD-10-CM | POA: Diagnosis not present

## 2019-12-23 DIAGNOSIS — Z0000 Encounter for general adult medical examination without abnormal findings: Secondary | ICD-10-CM | POA: Diagnosis not present

## 2019-12-23 DIAGNOSIS — E559 Vitamin D deficiency, unspecified: Secondary | ICD-10-CM | POA: Diagnosis not present

## 2019-12-23 DIAGNOSIS — K429 Umbilical hernia without obstruction or gangrene: Secondary | ICD-10-CM | POA: Diagnosis not present

## 2020-01-09 ENCOUNTER — Ambulatory Visit: Payer: Self-pay | Admitting: General Surgery

## 2020-01-09 DIAGNOSIS — A630 Anogenital (venereal) warts: Secondary | ICD-10-CM | POA: Diagnosis not present

## 2020-01-09 NOTE — H&P (Addendum)
The patient is a 59 year old male who presents with anal lesions.Patient here for evaluation of anal condyloma. Patient is status post laser ablation in the past and has had seriel evaluations. In June 2016 he underwent cryoablation of 2 small perianal lesions. His last few exams were normal. He has a couple lesions that were cryoablated in July 2017. He is having regular bowel movements. He continues to notice 2 lesions in his posterior scrotum.   Problem List/Past Medical Romie Levee, MD; 01/09/2020 3:12 PM) Parkinson's disease  ANAL CONDYLOMA (A63.0)  Past Surgical History Romie Levee, MD; 01/09/2020 3:12 PM) Open Inguinal Hernia Surgery Bilateral.  Diagnostic Studies History Romie Levee, MD; 01/09/2020 3:12 PM) Colonoscopy 1-5 years ago  Allergies Doristine Devoid, CMA; 01/09/2020 2:49 PM) No Known Drug Allergies [03/01/2014]:  Medication History Doristine Devoid, CMA; 01/09/2020 2:49 PM) rOPINIRole HCl (1MG  Tablet, Oral) Active. Simvastatin (40MG  Tablet, Oral) Active. Medications Reconciled  Social History , MD; 01/09/2020 3:12 PM) No drug use  Family History Romie Levee, MD; 01/09/2020 3:12 PM) First Degree Relatives No pertinent family history  Other Problems Romie Levee, MD; 01/09/2020 3:12 PM) No pertinent past medical history     Review of Systems Romie Levee MD; 01/09/2020 3:12 PM) General Not Present- Appetite Loss, Chills, Fatigue, Fever, Night Sweats, Weight Gain and Weight Loss. Gastrointestinal Not Present- Abdominal Pain, Bloating, Bloody Stool, Change in Bowel Habits, Chronic diarrhea, Constipation, Difficulty Swallowing, Excessive gas, Gets full quickly at meals, Hemorrhoids, Indigestion, Nausea, Rectal Pain and Vomiting. Male Genitourinary Not Present- Blood in Urine, Change in Urinary Stream, Frequency, Impotence, Nocturia, Painful Urination, Urgency and Urine Leakage.  Vitals (Chemira Jones CMA; 01/09/2020 2:49  PM) 01/09/2020 2:49 PM Weight: 219.2 lb Height: 68in Body Surface Area: 2.13 m Body Mass Index: 33.33 kg/m  Pulse: 100 (Regular)  BP: 110/78(Sitting, Left Arm, Standard)  Physical Exam 13/10/2019 MD; 01/09/2020 3:14 PM)  General Mental Status-Alert. General Appearance-Cooperative.  Rectal Anorectal Exam External - Note: 2 moderate sized anterior lesions near his scrotum. 1 lesion approximately 1 cm in diameter in his posterior perianal region. Several other smaller perianal lesions noted with questionable activity.    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 He has 2 anterior scrotal lesions and one posterior gluteal lesion that I have recommended ablating. There are a few other smaller areas that we will check with acetic acid and most likely ablate as well. We have discussed this in detail. Patient is aware of the risk and benefits of surgery, as he has had this several times before. We discussed the management of anal warts. We discussed chemical destruction, immunotherapy, and surgical excision. I discussed the pros and cons of each approach. We discussed the risk and benefits and the expected outcome with chemical destruction with agents such as podophyllin. I explained that podophyllin is generally not been effective and has a high recurrence rate. We discussed the use of Aldara ointment. I explained that it has a 30-70% chance at resolving or at least reducing the number of anal warts. I explained that it is applied 3 times a week at night and left on overnight. I explained that skin irritation is the most common side effect. We then discussed surgical excision specifically excision and fulguration. I explained how the surgery is performed. I explained that it can be painful however it generally has the highest success rate. We discussed the risk and benefits of  surgery  including but not limited to bleeding, infection, injury to surrounding structures, need to do a formal anoscopic exam to evaluate for anal canal warts, urinary retention, wart recurrence, and general anesthesia risk. We discussed the typical aftercare.

## 2020-01-09 NOTE — H&P (View-Only) (Signed)
The patient is a 59 year old male who presents with anal lesions.Patient here for evaluation of anal condyloma. Patient is status post laser ablation in the past and has had seriel evaluations. In June 2016 he underwent cryoablation of 2 small perianal lesions. His last few exams were normal. He has a couple lesions that were cryoablated in July 2017. He is having regular bowel movements. He continues to notice 2 lesions in his posterior scrotum.   Problem List/Past Medical Romie Levee, MD; 01/09/2020 3:12 PM) Parkinson's disease  ANAL CONDYLOMA (A63.0)  Past Surgical History Romie Levee, MD; 01/09/2020 3:12 PM) Open Inguinal Hernia Surgery Bilateral.  Diagnostic Studies History Romie Levee, MD; 01/09/2020 3:12 PM) Colonoscopy 1-5 years ago  Allergies Doristine Devoid, CMA; 01/09/2020 2:49 PM) No Known Drug Allergies [03/01/2014]:  Medication History Doristine Devoid, CMA; 01/09/2020 2:49 PM) rOPINIRole HCl (1MG  Tablet, Oral) Active. Simvastatin (40MG  Tablet, Oral) Active. Medications Reconciled  Social History , MD; 01/09/2020 3:12 PM) No drug use  Family History Romie Levee, MD; 01/09/2020 3:12 PM) First Degree Relatives No pertinent family history  Other Problems Romie Levee, MD; 01/09/2020 3:12 PM) No pertinent past medical history     Review of Systems Romie Levee MD; 01/09/2020 3:12 PM) General Not Present- Appetite Loss, Chills, Fatigue, Fever, Night Sweats, Weight Gain and Weight Loss. Gastrointestinal Not Present- Abdominal Pain, Bloating, Bloody Stool, Change in Bowel Habits, Chronic diarrhea, Constipation, Difficulty Swallowing, Excessive gas, Gets full quickly at meals, Hemorrhoids, Indigestion, Nausea, Rectal Pain and Vomiting. Male Genitourinary Not Present- Blood in Urine, Change in Urinary Stream, Frequency, Impotence, Nocturia, Painful Urination, Urgency and Urine Leakage.  Vitals (Chemira Jones CMA; 01/09/2020 2:49  PM) 01/09/2020 2:49 PM Weight: 219.2 lb Height: 68in Body Surface Area: 2.13 m Body Mass Index: 33.33 kg/m  Pulse: 100 (Regular)  BP: 110/78(Sitting, Left Arm, Standard)  Physical Exam 13/10/2019 MD; 01/09/2020 3:14 PM)  General Mental Status-Alert. General Appearance-Cooperative.  Rectal Anorectal Exam External - Note: 2 moderate sized anterior lesions near his scrotum. 1 lesion approximately 1 cm in diameter in his posterior perianal region. Several other smaller perianal lesions noted with questionable activity.    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 He has 2 anterior scrotal lesions and one posterior gluteal lesion that I have recommended ablating. There are a few other smaller areas that we will check with acetic acid and most likely ablate as well. We have discussed this in detail. Patient is aware of the risk and benefits of surgery, as he has had this several times before. We discussed the management of anal warts. We discussed chemical destruction, immunotherapy, and surgical excision. I discussed the pros and cons of each approach. We discussed the risk and benefits and the expected outcome with chemical destruction with agents such as podophyllin. I explained that podophyllin is generally not been effective and has a high recurrence rate. We discussed the use of Aldara ointment. I explained that it has a 30-70% chance at resolving or at least reducing the number of anal warts. I explained that it is applied 3 times a week at night and left on overnight. I explained that skin irritation is the most common side effect. We then discussed surgical excision specifically excision and fulguration. I explained how the surgery is performed. I explained that it can be painful however it generally has the highest success rate. We discussed the risk and benefits of  surgery  including but not limited to bleeding, infection, injury to surrounding structures, need to do a formal anoscopic exam to evaluate for anal canal warts, urinary retention, wart recurrence, and general anesthesia risk. We discussed the typical aftercare.  

## 2020-01-19 DIAGNOSIS — Z20822 Contact with and (suspected) exposure to covid-19: Secondary | ICD-10-CM | POA: Diagnosis not present

## 2020-01-23 DIAGNOSIS — Z20822 Contact with and (suspected) exposure to covid-19: Secondary | ICD-10-CM | POA: Diagnosis not present

## 2020-01-24 ENCOUNTER — Other Ambulatory Visit: Payer: Self-pay

## 2020-01-24 ENCOUNTER — Encounter (HOSPITAL_BASED_OUTPATIENT_CLINIC_OR_DEPARTMENT_OTHER): Payer: Self-pay | Admitting: General Surgery

## 2020-01-24 NOTE — Progress Notes (Signed)
Spoke w/ via phone for pre-op interview--- PT Lab needs dos----  no             Lab results------ no COVID test ------ 01-30-2020 @ 0815 Arrive at ------- 0800 NPO after MN NO Solid Food.  Clear liquids from MN until--- 0700 Medications to take morning of surgery ----- NONE Diabetic medication ----- n/a Patient Special Instructions ----- n/a Pre-Op special Istructions ----- n/a Patient verbalized understanding of instructions that were given at this phone interview. Patient denies shortness of breath, chest pain, fever, cough at this phone interview.

## 2020-01-30 ENCOUNTER — Other Ambulatory Visit (HOSPITAL_COMMUNITY)
Admission: RE | Admit: 2020-01-30 | Discharge: 2020-01-30 | Disposition: A | Discharge status: Home / Self Care | Payer: BC Managed Care – PPO | Source: Ambulatory Visit | Attending: General Surgery | Admitting: General Surgery

## 2020-01-30 DIAGNOSIS — K645 Perianal venous thrombosis: Secondary | ICD-10-CM | POA: Diagnosis not present

## 2020-01-30 DIAGNOSIS — F17200 Nicotine dependence, unspecified, uncomplicated: Secondary | ICD-10-CM | POA: Diagnosis not present

## 2020-01-30 DIAGNOSIS — Z20822 Contact with and (suspected) exposure to covid-19: Secondary | ICD-10-CM | POA: Insufficient documentation

## 2020-01-30 DIAGNOSIS — Z79899 Other long term (current) drug therapy: Secondary | ICD-10-CM | POA: Diagnosis not present

## 2020-01-30 DIAGNOSIS — Z01818 Encounter for other preprocedural examination: Secondary | ICD-10-CM | POA: Insufficient documentation

## 2020-01-30 DIAGNOSIS — A630 Anogenital (venereal) warts: Secondary | ICD-10-CM | POA: Diagnosis not present

## 2020-01-30 LAB — SARS CORONAVIRUS 2 (TAT 6-24 HRS): SARS Coronavirus 2: NEGATIVE

## 2020-02-02 ENCOUNTER — Ambulatory Visit (HOSPITAL_BASED_OUTPATIENT_CLINIC_OR_DEPARTMENT_OTHER): Payer: BC Managed Care – PPO | Admitting: Anesthesiology

## 2020-02-02 ENCOUNTER — Encounter (HOSPITAL_BASED_OUTPATIENT_CLINIC_OR_DEPARTMENT_OTHER)
Admission: RE | Disposition: A | Discharge status: Home / Self Care | Payer: Self-pay | Source: Home / Self Care | Attending: General Surgery

## 2020-02-02 ENCOUNTER — Other Ambulatory Visit: Payer: Self-pay

## 2020-02-02 ENCOUNTER — Encounter (HOSPITAL_BASED_OUTPATIENT_CLINIC_OR_DEPARTMENT_OTHER): Payer: Self-pay | Admitting: General Surgery

## 2020-02-02 ENCOUNTER — Ambulatory Visit (HOSPITAL_BASED_OUTPATIENT_CLINIC_OR_DEPARTMENT_OTHER)
Admission: RE | Admit: 2020-02-02 | Discharge: 2020-02-02 | Disposition: A | Discharge status: Home / Self Care | Payer: BC Managed Care – PPO | Attending: General Surgery | Admitting: General Surgery

## 2020-02-02 DIAGNOSIS — A630 Anogenital (venereal) warts: Secondary | ICD-10-CM | POA: Diagnosis not present

## 2020-02-02 DIAGNOSIS — F17200 Nicotine dependence, unspecified, uncomplicated: Secondary | ICD-10-CM | POA: Insufficient documentation

## 2020-02-02 DIAGNOSIS — K645 Perianal venous thrombosis: Secondary | ICD-10-CM | POA: Insufficient documentation

## 2020-02-02 DIAGNOSIS — E785 Hyperlipidemia, unspecified: Secondary | ICD-10-CM | POA: Diagnosis not present

## 2020-02-02 DIAGNOSIS — Z79899 Other long term (current) drug therapy: Secondary | ICD-10-CM | POA: Diagnosis not present

## 2020-02-02 DIAGNOSIS — Z20822 Contact with and (suspected) exposure to covid-19: Secondary | ICD-10-CM | POA: Insufficient documentation

## 2020-02-02 HISTORY — PX: LASER ABLATION CONDOLAMATA: SHX5941

## 2020-02-02 HISTORY — PX: RECTAL EXAM UNDER ANESTHESIA: SHX6399

## 2020-02-02 HISTORY — DX: Hyperlipidemia, unspecified: E78.5

## 2020-02-02 SURGERY — ABLATION, CONDYLOMA, USING LASER
Anesthesia: Monitor Anesthesia Care

## 2020-02-02 MED ORDER — ONDANSETRON HCL 4 MG/2ML IJ SOLN
INTRAMUSCULAR | Status: DC | PRN
  Administered 2020-02-02: 4 mg via INTRAVENOUS

## 2020-02-02 MED ORDER — PROPOFOL 10 MG/ML IV BOLUS
INTRAVENOUS | Status: AC
  Filled 2020-02-02: qty 20

## 2020-02-02 MED ORDER — ACETAMINOPHEN 325 MG PO TABS: 650.0000 mg | ORAL_TABLET | ORAL | Status: DC | PRN

## 2020-02-02 MED ORDER — MIDAZOLAM HCL 5 MG/5ML IJ SOLN
INTRAMUSCULAR | Status: DC | PRN
  Administered 2020-02-02: 2 mg via INTRAVENOUS

## 2020-02-02 MED ORDER — BUPIVACAINE LIPOSOME 1.3 % IJ SUSP
INTRAMUSCULAR | Status: DC | PRN
  Administered 2020-02-02: 20 mL

## 2020-02-02 MED ORDER — MIDAZOLAM HCL 2 MG/2ML IJ SOLN
INTRAMUSCULAR | Status: AC
  Filled 2020-02-02: qty 2

## 2020-02-02 MED ORDER — 0.9 % SODIUM CHLORIDE (POUR BTL) OPTIME
TOPICAL | Status: DC | PRN
  Administered 2020-02-02: 1000 mL

## 2020-02-02 MED ORDER — SODIUM CHLORIDE 0.9% FLUSH: 3.0000 mL | INTRAVENOUS | Status: DC | PRN

## 2020-02-02 MED ORDER — LIDOCAINE HCL (PF) 2 % IJ SOLN
INTRAMUSCULAR | Status: AC
  Filled 2020-02-02: qty 5

## 2020-02-02 MED ORDER — ONDANSETRON HCL 4 MG/2ML IJ SOLN
INTRAMUSCULAR | Status: AC
  Filled 2020-02-02: qty 2

## 2020-02-02 MED ORDER — TRAMADOL HCL 50 MG PO TABS
50.0000 mg | ORAL_TABLET | Freq: Four times a day (QID) | ORAL | 0 refills | Status: DC | PRN
Start: 2020-02-02 — End: 2020-03-15

## 2020-02-02 MED ORDER — ACETAMINOPHEN 325 MG RE SUPP: 650.0000 mg | RECTAL | Status: DC | PRN

## 2020-02-02 MED ORDER — SILVER SULFADIAZINE 1 % EX CREA
TOPICAL_CREAM | CUTANEOUS | Status: DC | PRN
  Administered 2020-02-02: 1 via TOPICAL

## 2020-02-02 MED ORDER — DEXMEDETOMIDINE (PRECEDEX) IN NS 20 MCG/5ML (4 MCG/ML) IV SYRINGE
PREFILLED_SYRINGE | INTRAVENOUS | Status: DC | PRN
  Administered 2020-02-02 (×3): 4 ug via INTRAVENOUS

## 2020-02-02 MED ORDER — ACETAMINOPHEN 500 MG PO TABS
1000.0000 mg | ORAL_TABLET | ORAL | Status: AC
  Administered 2020-02-02: 1000 mg via ORAL

## 2020-02-02 MED ORDER — BUPIVACAINE LIPOSOME 1.3 % IJ SUSP: 20.0000 mL | Freq: Once | INTRAMUSCULAR | Status: DC

## 2020-02-02 MED ORDER — DEXAMETHASONE SODIUM PHOSPHATE 10 MG/ML IJ SOLN
INTRAMUSCULAR | Status: AC
  Filled 2020-02-02: qty 1

## 2020-02-02 MED ORDER — DEXAMETHASONE SODIUM PHOSPHATE 4 MG/ML IJ SOLN
INTRAMUSCULAR | Status: DC | PRN
  Administered 2020-02-02: 4 mg via INTRAVENOUS

## 2020-02-02 MED ORDER — OXYCODONE HCL 5 MG PO TABS: 5.0000 mg | ORAL_TABLET | ORAL | Status: DC | PRN

## 2020-02-02 MED ORDER — OXYCODONE HCL 5 MG PO TABS: 5.0000 mg | ORAL_TABLET | Freq: Once | ORAL | Status: DC | PRN

## 2020-02-02 MED ORDER — CELECOXIB 200 MG PO CAPS
ORAL_CAPSULE | ORAL | Status: AC
  Filled 2020-02-02: qty 1

## 2020-02-02 MED ORDER — SODIUM CHLORIDE 0.9 % IV SOLN: 250.0000 mL | INTRAVENOUS | Status: DC | PRN

## 2020-02-02 MED ORDER — LIDOCAINE HCL (CARDIAC) PF 100 MG/5ML IV SOSY
PREFILLED_SYRINGE | INTRAVENOUS | Status: DC | PRN
  Administered 2020-02-02: 40 mg via INTRAVENOUS

## 2020-02-02 MED ORDER — AMISULPRIDE (ANTIEMETIC) 5 MG/2ML IV SOLN: 10.0000 mg | Freq: Once | INTRAVENOUS | Status: DC | PRN

## 2020-02-02 MED ORDER — FENTANYL CITRATE (PF) 100 MCG/2ML IJ SOLN: 25.0000 ug | INTRAMUSCULAR | Status: DC | PRN

## 2020-02-02 MED ORDER — LACTATED RINGERS IV SOLN: INTRAVENOUS | Status: DC

## 2020-02-02 MED ORDER — SODIUM CHLORIDE 0.9% FLUSH: 3.0000 mL | Freq: Two times a day (BID) | INTRAVENOUS | Status: DC

## 2020-02-02 MED ORDER — ACETIC ACID 5 % SOLN
Status: DC | PRN
  Administered 2020-02-02: 1 via TOPICAL

## 2020-02-02 MED ORDER — OXYCODONE HCL 5 MG/5ML PO SOLN: 5.0000 mg | Freq: Once | ORAL | Status: DC | PRN

## 2020-02-02 MED ORDER — FENTANYL CITRATE (PF) 100 MCG/2ML IJ SOLN
INTRAMUSCULAR | Status: AC
  Filled 2020-02-02: qty 2

## 2020-02-02 MED ORDER — BUPIVACAINE-EPINEPHRINE 0.5% -1:200000 IJ SOLN
INTRAMUSCULAR | Status: DC | PRN
  Administered 2020-02-02: 30 mL

## 2020-02-02 MED ORDER — ONDANSETRON HCL 4 MG/2ML IJ SOLN: 4.0000 mg | Freq: Once | INTRAMUSCULAR | Status: DC | PRN

## 2020-02-02 MED ORDER — PROPOFOL 500 MG/50ML IV EMUL
INTRAVENOUS | Status: DC | PRN
  Administered 2020-02-02: 200 ug/kg/min via INTRAVENOUS

## 2020-02-02 MED ORDER — CELECOXIB 200 MG PO CAPS
200.0000 mg | ORAL_CAPSULE | ORAL | Status: AC
  Administered 2020-02-02: 200 mg via ORAL

## 2020-02-02 MED ORDER — ACETAMINOPHEN 500 MG PO TABS
ORAL_TABLET | ORAL | Status: AC
  Filled 2020-02-02: qty 2

## 2020-02-02 SURGICAL SUPPLY — 52 items
APL SKNCLS STERI-STRIP NONHPOA (GAUZE/BANDAGES/DRESSINGS) ×2
BENZOIN TINCTURE PRP APPL 2/3 (GAUZE/BANDAGES/DRESSINGS) ×4 IMPLANT
BLADE EXTENDED COATED 6.5IN (ELECTRODE) IMPLANT
BLADE HEX COATED 2.75 (ELECTRODE) ×2 IMPLANT
BLADE SURG 10 STRL SS (BLADE) IMPLANT
BRIEF STRETCH FOR OB PAD LRG (UNDERPADS AND DIAPERS) ×2 IMPLANT
CANISTER SUCT 3000ML PPV (MISCELLANEOUS) ×2 IMPLANT
COVER BACK TABLE 60X90IN (DRAPES) ×2 IMPLANT
COVER MAYO STAND STRL (DRAPES) ×2 IMPLANT
COVER WAND RF STERILE (DRAPES) ×2 IMPLANT
DECANTER SPIKE VIAL GLASS SM (MISCELLANEOUS) ×2 IMPLANT
DRAPE LAPAROTOMY 100X72 PEDS (DRAPES) ×2 IMPLANT
DRAPE UTILITY XL STRL (DRAPES) ×2 IMPLANT
DRSG PAD ABDOMINAL 8X10 ST (GAUZE/BANDAGES/DRESSINGS) ×2 IMPLANT
ELECT REM PT RETURN 9FT ADLT (ELECTROSURGICAL) ×2
ELECTRODE REM PT RTRN 9FT ADLT (ELECTROSURGICAL) ×1 IMPLANT
GAUZE SPONGE 4X4 12PLY STRL (GAUZE/BANDAGES/DRESSINGS) ×2 IMPLANT
GLOVE BIO SURGEON STRL SZ 6.5 (GLOVE) ×2 IMPLANT
GLOVE BIOGEL PI IND STRL 7.0 (GLOVE) ×1 IMPLANT
GLOVE BIOGEL PI INDICATOR 7.0 (GLOVE) ×1
GOWN STRL REUS W/TWL XL LVL3 (GOWN DISPOSABLE) ×2 IMPLANT
HYDROGEN PEROXIDE 16OZ (MISCELLANEOUS) ×2 IMPLANT
IV CATH 14GX2 1/4 (CATHETERS) ×2 IMPLANT
IV CATH 18G SAFETY (IV SOLUTION) ×2 IMPLANT
KIT SIGMOIDOSCOPE (SET/KITS/TRAYS/PACK) IMPLANT
KIT TURNOVER CYSTO (KITS) ×2 IMPLANT
LOOP VESSEL MAXI BLUE (MISCELLANEOUS) IMPLANT
NDL SAFETY ECLIPSE 18X1.5 (NEEDLE) IMPLANT
NEEDLE HYPO 18GX1.5 SHARP (NEEDLE)
NEEDLE HYPO 22GX1.5 SAFETY (NEEDLE) ×2 IMPLANT
NS IRRIG 500ML POUR BTL (IV SOLUTION) ×2 IMPLANT
PACK BASIN DAY SURGERY FS (CUSTOM PROCEDURE TRAY) ×2 IMPLANT
PAD ARMBOARD 7.5X6 YLW CONV (MISCELLANEOUS) IMPLANT
PENCIL SMOKE EVACUATOR (MISCELLANEOUS) ×2 IMPLANT
SPONGE HEMORRHOID 8X3CM (HEMOSTASIS) IMPLANT
SPONGE SURGIFOAM ABS GEL 12-7 (HEMOSTASIS) IMPLANT
SUCTION FRAZIER HANDLE 10FR (MISCELLANEOUS)
SUCTION TUBE FRAZIER 10FR DISP (MISCELLANEOUS) IMPLANT
SUT CHROMIC 2 0 SH (SUTURE) IMPLANT
SUT CHROMIC 3 0 SH 27 (SUTURE) IMPLANT
SUT ETHIBOND 0 (SUTURE) IMPLANT
SUT VIC AB 2-0 SH 27 (SUTURE)
SUT VIC AB 2-0 SH 27XBRD (SUTURE) IMPLANT
SUT VIC AB 3-0 SH 18 (SUTURE) IMPLANT
SUT VIC AB 3-0 SH 27 (SUTURE)
SUT VIC AB 3-0 SH 27XBRD (SUTURE) IMPLANT
SYR CONTROL 10ML LL (SYRINGE) ×2 IMPLANT
TOWEL OR 17X26 10 PK STRL BLUE (TOWEL DISPOSABLE) ×4 IMPLANT
TRAY DSU PREP LF (CUSTOM PROCEDURE TRAY) ×2 IMPLANT
TUBE CONNECTING 12X1/4 (SUCTIONS) ×2 IMPLANT
VACUUM HOSE 7/8X10 W/ WAND (MISCELLANEOUS) ×2 IMPLANT
YANKAUER SUCT BULB TIP NO VENT (SUCTIONS) ×2 IMPLANT

## 2020-02-02 NOTE — Anesthesia Procedure Notes (Signed)
Procedure Name: MAC Performed by: Mechele Claude, CRNA Pre-anesthesia Checklist: Patient identified, Emergency Drugs available, Suction available and Patient being monitored Oxygen Delivery Method: Simple face mask Placement Confirmation: positive ETCO2 and breath sounds checked- equal and bilateral

## 2020-02-02 NOTE — Anesthesia Postprocedure Evaluation (Signed)
Anesthesia Post Note  Patient: Wesley [name].  Procedure(s) Performed: LASER ABLATION CONDYLOMA (N/A ) RECTAL EXAM UNDER ANESTHESIA (N/A )     Patient location during evaluation: PACU Anesthesia Type: MAC Level of consciousness: awake and alert Pain management: pain level controlled Vital Signs Assessment: post-procedure vital signs reviewed and stable Respiratory status: spontaneous breathing, nonlabored ventilation and respiratory function stable Cardiovascular status: blood pressure returned to baseline and stable Postop Assessment: no apparent nausea or vomiting Anesthetic complications: no   No complications documented.  Last Vitals:  Vitals:   02/02/20 1115 02/02/20 1130  BP: 124/67 126/77  Pulse: 70 65  Resp: 19 13  Temp:    SpO2: 100% 97%    Last Pain:  Vitals:   02/02/20 1155  TempSrc:   PainSc: 3                  Lidia Collum

## 2020-02-02 NOTE — Transfer of Care (Signed)
Immediate Anesthesia Transfer of Care Note  Patient: Wesley [name].  Procedure(s) Performed: Procedure(s) (LRB): LASER ABLATION CONDYLOMA (N/A) RECTAL EXAM UNDER ANESTHESIA (N/A)  Patient Location: PACU  Anesthesia Type: MAC  Level of Consciousness: awake, alert  and oriented  Airway & Oxygen Therapy: Patient Spontanous Breathing and Patient connected to face mask oxygen  Post-op Assessment: Report given to PACU RN and Post -op Vital signs reviewed and stable  Post vital signs: Reviewed and stable  Complications: No apparent anesthesia complications Last Vitals:  Vitals Value Taken Time  BP 122/67 02/02/20 1045  Temp 36.4 C 02/02/20 1044  Pulse 84 02/02/20 1045  Resp 13 02/02/20 1045  SpO2 100 % 02/02/20 1045  Vitals shown include unvalidated device data.  Last Pain:  Vitals:   02/02/20 0849  TempSrc: Oral  PainSc: 0-No pain      Patients Stated Pain Goal: 5 (72/76/18 4859)  Complications: No complications documented.

## 2020-02-02 NOTE — Discharge Instructions (Addendum)
Post Operative Instructions Following Laser Surgery  Laser treatment of condyloma (warts) is used to vaporize or eliminate the wart.  The laser actually creates a burn effect on the skin to accomplish this.  The following instructions will help in you comfort postoperatively:  First 24 h  Remove the dressing this evening after you return home from the hospital  Sit in a tub or sitz bath of COOL water for 20 minutes every hour while awake.  After the bath, carefully blot the area dry and place a piece of 100% Cotton with Silvadene on it next to the anal opening.  You may sit on a covered ice pack between the baths as needed.    You should have crushed ice in small amounts until you are able to urinate.  After you urinate, you may drink fluids.  It is normal to not urinate for the first few hours after surgery.  If you become uncomfortable, call the office for instructions.   Take your pain medications as prescribed  You may eat whatever you feel like.  Start with a light meal and gradually advance your diet.    Beginning the day after your surgery  Sit in a tub of cool to warm water for 15 minutes at least 3 times a day and after bowel movements  After the bowel movement, clean with wet cotton, Tucks or baby wipes.  Apply Silvadene to a thin piece of cotton and place over the anal opening after your baths.  This will collect any drainage or bleeding.  Drainage is usually a pink to tan color and is normal for the following 2-3 weeks after surgery.  Change to cotton ever 2-3 hours while awake.  Diet  Eat a regular diet.  Avoid foods that may constipate you or give you diarrhea.  Avoid foods with seeds, nuts, corn or popcorn.  Beginning the day after surgery, drink 6-8 glasses of water a day in addition to your meals.  Limit you caffeine intake to 1-2 servings per day.  Medication  Take a fiber supplement (Metamucil, Citrucel, FiberCon) twice a day.  Take a stool softener like Colace  twice daily  Take your pain medication as directed.  You may use Extra Strength Tylenol instead of your prescribed pain medication to relieve mild discomfort.  Avoid products containing aspirin.  Bowel Habits  You should have a bowel movement at least every other day.  If you are constipated, you may take a Fleet enema or 2 Dulcolax tablets.  Call the office if no results occur.  You may bear down a normal amount to have a bowel movement without hurting your tissues after the operation.  Activity  Walking is encouraged.  You may drive when you are no longer on prescription pain medication.  You may go up and down stairs carefully.  No heavy lifting or strenuous activity until after your first post operative appointment  Do NOT sit on a rubber ring; instead use a soft pillow if needed.  Call the office if you have any questions or concerns.  Call IMMEDIATELY if you develop:  Rectal bleeding  Increasing rectal pain  Fever greater than 100 F  Difficulty urinating   Information for Discharge Teaching: EXPAREL (bupivacaine liposome injectable suspension)   Your surgeon gave you EXPAREL(bupivacaine) in your surgical incision to help control your pain after surgery.   EXPAREL is a local anesthetic that provides pain relief by numbing the tissue around the surgical site.  EXPAREL is designed  to release pain medication over time and can control pain for up to 72 hours.  Depending on how you respond to EXPAREL, you may require less pain medication during your recovery.  Possible side effects:  Temporary loss of sensation or ability to move in the area where bupivacaine was injected.  Nausea, vomiting, constipation  Rarely, numbness and tingling in your mouth or lips, lightheadedness, or anxiety may occur.  Call your doctor right away if you think you may be experiencing any of these sensations, or if you have other questions regarding possible side effects.  Follow all other  discharge instructions given to you by your surgeon or nurse. Eat a healthy diet and drink plenty of water or other fluids.  If you return to the hospital for any reason within 96 hours following the administration of EXPAREL, please inform your health care providers.   Post Anesthesia Home Care Instructions  Activity: Get plenty of rest for the remainder of the day. A responsible adult should stay with you for 24 hours following the procedure.  For the next 24 hours, DO NOT: -Drive a car -Advertising copywriter -Drink alcoholic beverages -Take any medication unless instructed by your physician -Make any legal decisions or sign important papers.  Meals: Start with liquid foods such as gelatin or soup. Progress to regular foods as tolerated. Avoid greasy, spicy, heavy foods. If nausea and/or vomiting occur, drink only clear liquids until the nausea and/or vomiting subsides. Call your physician if vomiting continues.  Special Instructions/Symptoms: Your throat may feel dry or sore from the anesthesia or the breathing tube placed in your throat during surgery. If this causes discomfort, gargle with warm salt water. The discomfort should disappear within 24 hours.  If you had a scopolamine patch placed behind your ear for the management of post- operative nausea and/or vomiting:  1. The medication in the patch is effective for 72 hours, after which it should be removed.  Wrap patch in a tissue and discard in the trash. Wash hands thoroughly with soap and water. 2. You may remove the patch earlier than 72 hours if you experience unpleasant side effects which may include dry mouth, dizziness or visual disturbances. 3. Avoid touching the patch. Wash your hands with soap and water after contact with the patch.

## 2020-02-02 NOTE — Op Note (Signed)
02/02/2020  10:34 AM  PATIENT:  Wesley [name]  59 y.o. male  Patient Care Team: Lawerance Cruel, MD as PCP - General (Family Medicine)  PRE-OPERATIVE DIAGNOSIS:  ANAL CONDYLOMA  POST-OPERATIVE DIAGNOSIS: Anal Condyloma  PROCEDURE:   LASER ABLATION CONDYLOMA RECTAL EXAM UNDER ANESTHESIA    Surgeon(s): Leighton Ruff, MD  ASSISTANT: Sheria Lang, MD   ANESTHESIA:   local and MAC  EBL: 57m  Total I/O In: 700 [I.V.:700] Out: -   SPECIMEN: none  DISPOSITION OF SPECIMEN:  N/A  COUNTS:  YES  PLAN OF CARE: Discharge to home after PACU  PATIENT DISPOSITION:  PACU - hemodynamically stable.  INDICATION: 59y.o. M with recurrent perianal condyloma  OR FINDINGS: perianal condyloma, thrombosed R posterior hemorrhoid  DESCRIPTION: The patient was identified in the preoperative holding area and taken to the OR where they were laid prone on the operating room table in jack knife position. MAC anesthesia was smoothly induced.  The patient was then prepped and draped in the usual sterile fashion. A surgical timeout was performed indicating the correct patient, procedure, positioning and preoperative antibioitics. SCDs were noted to be in place and functioning prior to the operation.   After this was completed, a sponge was soaked in 5% acetic acid was placed over the perianal region. This was allowed to soak for 2 minutes. The sponge was removed and the perianal region was evaluated.  There was no internal condyloma noted.  There were several small perianal condyloma noted.  There were 2 large lesions at the base of the patient's scrotum on the left side.  Next the laser was brought onto the field.  The edges of the operative field were draped with wet towels.  Appropriate ventilation was obtained.  All staff were protected with small particle masks and goggles safe for the laser.  The laser was then activated. All condylomatous lesions were ablated. Hemostasis was then achieved  using electrocautery. A thin layer of Silvadene was then placed over the lesions. A sterile dressing was applied over this. The patient was then awakened from anesthesia and sent to the postanesthesia care unit in stable condition. All counts were correct operating room staff.

## 2020-02-02 NOTE — Interval H&P Note (Signed)
History and Physical Interval Note:  02/02/2020 8:25 AM  Wesley [name].  has presented today for surgery, with the diagnosis of ANAL CONDYLOMA.  The various methods of treatment have been discussed with the patient and family. After consideration of risks, benefits and other options for treatment, the patient has consented to  Procedure(s): LASER ABLATION CONDYLOMA (N/A) RECTAL EXAM UNDER ANESTHESIA (N/A) as a surgical intervention.  The patient's history has been reviewed, patient examined, no change in status, stable for surgery.  I have reviewed the patient's chart and labs.  Questions were answered to the patient's satisfaction.     Vanita Panda, MD  Colorectal and General Surgery [facility] Surgery

## 2020-02-02 NOTE — Anesthesia Preprocedure Evaluation (Addendum)
Anesthesia Evaluation  Patient identified by MRN, date of birth, ID band Patient awake    Reviewed: Allergy & Precautions, NPO status , Patient's Chart, lab work & pertinent test results  History of Anesthesia Complications Negative for: history of anesthetic complications  Airway Mallampati: II  TM Distance: >3 FB Neck ROM: Full    Dental   Pulmonary Current Smoker and Patient abstained from smoking.,    Pulmonary exam normal        Cardiovascular Normal cardiovascular exam  HLD   Neuro/Psych Parkinson's negative psych ROS   GI/Hepatic Neg liver ROS, Anal condyloma   Endo/Other  negative endocrine ROS  Renal/GU negative Renal ROS  negative genitourinary   Musculoskeletal negative musculoskeletal ROS (+)   Abdominal   Peds  Hematology negative hematology ROS (+)   Anesthesia Other Findings   Reproductive/Obstetrics                            Anesthesia Physical Anesthesia Plan  ASA: II  Anesthesia Plan: MAC   Post-op Pain Management:    Induction: Intravenous  PONV Risk Score and Plan: 1 and Propofol infusion, TIVA and Treatment may vary due to age or medical condition  Airway Management Planned: Natural Airway, Nasal Cannula and Simple Face Mask  Additional Equipment: None  Intra-op Plan:   Post-operative Plan:   Informed Consent: I have reviewed the patients History and Physical, chart, labs and discussed the procedure including the risks, benefits and alternatives for the proposed anesthesia with the patient or authorized representative who has indicated his/her understanding and acceptance.       Plan Discussed with:   Anesthesia Plan Comments:        Anesthesia Quick Evaluation

## 2020-02-03 ENCOUNTER — Encounter (HOSPITAL_BASED_OUTPATIENT_CLINIC_OR_DEPARTMENT_OTHER): Payer: Self-pay | Admitting: General Surgery

## 2020-02-27 ENCOUNTER — Other Ambulatory Visit: Payer: BC Managed Care – PPO

## 2020-02-27 DIAGNOSIS — Z20822 Contact with and (suspected) exposure to covid-19: Secondary | ICD-10-CM

## 2020-02-28 LAB — NOVEL CORONAVIRUS, NAA: SARS-CoV-2, NAA: NOT DETECTED

## 2020-02-28 LAB — SARS-COV-2, NAA 2 DAY TAT

## 2020-03-05 ENCOUNTER — Other Ambulatory Visit: Payer: Self-pay | Admitting: Neurology

## 2020-03-09 ENCOUNTER — Encounter: Payer: Self-pay | Admitting: Neurology

## 2020-03-12 NOTE — Progress Notes (Signed)
Assessment/Plan:    1.  Parkinson's disease  -I agree with the diagnosis.  When I discussed this.  We discussed the pathophysiology of the disease.  We discussed treatment options as well as prognostic indicators.  Patient education was provided.  -We talked about his ropinirole.  I think potentially it is causing compulsive eating.  He is getting up multiple times throughout the night to eat (7-9 times last night).  I think it may be something that we need to consider with his treatment and potentially taper somewhat.  -We discussed potentially adding levodopa given the above, and considering this is now first-line treatment for early stage Parkinson's (and added to the new AAN guidelines).  We discussed risks and benefits.  We discussed study results.  He is going to discuss these things with Dr. Frances Furbish.  -We discussed community resources in the area including patient support groups and community exercise programs for PD and pt education was provided to the patient.  He definitely needs to increase his exercise and start an exercise program.  -We discussed the concept of levodopa resistant tremor.  I know he has not been on levodopa itself, but discussed that, in general, 30 to 50% of Parkinson's patients can continue to have tremor despite treatment.  That does not mean that medication does not work for rigidity and bradykinesia, but it just does not help for tremor.  He may be 1 of those patients.  -Patient feels very comfortable at [facility] neurology and is happy with treatment there.  He will follow-up with [facility] neurology for continued treatment.   Subjective:   Wesley [name]. was seen today in the movement disorders clinic for neurologic consultation..  The consultation is for the evaluation of Parkinson's disease.  Patient was previously seen at [facility] neurology and presents today for a second opinion only (happy with care there, just wanted another opinion).  Prior medical  records were reviewed.  Patient's first visit at [facility] neurology with Dr. Frances Furbish was on August 04, 2019 with initial complaints of intermittent tremor of the left hand for 6 months.  DaTscan was recommended and completed on September 02, 2019.  I personally reviewed it.  There was decreased activity in the right caudate compared to that of the left.  He followed up with Dr. Frances Furbish on July 7 and was placed on ropinirole, working to 0.75 mg 3 times per day.  He called the office because he did not think that this was helpful and it was increased to 1 mg 3 times per day.  He followed back up on October 7 (his last visit) and his medication was increased to 2 mg 3 times per day.  He takes it in the AM, lunch and bed.  He has no SE with the medication but he does c/o RLS at night.  He does admit that he gets up multiple times in the middle of the night to eat.  He got up 7-9 times last night.  He doesn't think that tremor is better and "my hand hurts and is sore all of the time."  States that the soreness is just across the knuckles on the L.  States that tremor is best in the AM.  Gets worse as the day goes on.    Specific Symptoms:  Tremor: Yes.   Family hx of similar:  No. Voice: no change Sleep: some "jumping/jittering of the legs" at night  Vivid Dreams:  Yes.    Acting out  dreams:  No. Wet Pillows: No. Postural symptoms:  No.  Falls?  No. Bradykinesia symptoms: no bradykinesia noted Loss of smell:  No. Loss of taste:  No. Urinary Incontinence:  No. Difficulty Swallowing:  No. Handwriting, micrographia: No. Trouble with ADL's:  No.  Trouble buttoning clothing: Yes.   (just with the L hand, little trouble) Depression:  No. Memory changes:  No. Hallucinations:  No.  visual distortions: No. N/V:  No. Lightheaded:  No.  Syncope: No. Diplopia:  Yes.   , some when watching TV (esp if watching sporting event).  Will go away if closes eye Dyskinesia:  No. Prior exposure to reglan/antipsychotics:  No.   PREVIOUS MEDICATIONS: Requip  ALLERGIES:  No Known Allergies  CURRENT MEDICATIONS:  Current Outpatient Medications  Medication Instructions   acetaminophen (TYLENOL) 650 mg, Oral, Every 6 hours PRN   rOPINIRole (REQUIP) 1 MG tablet TAKE 2 TABLETS BY MOUTH 3 TIMES A DAY   simvastatin (ZOCOR) 40 mg, Oral, Daily, AM    Objective:   VITALS:   Vitals:   03/15/20 0848  BP: 112/66  Pulse: 76  SpO2: 99%  Weight: 222 lb (100.7 kg)  Height: 5\' 8"  (1.727 m)    GEN:  The patient appears stated age and is in NAD. HEENT:  Normocephalic, atraumatic.  The mucous membranes are moist. The superficial temporal arteries are without ropiness or tenderness. CV:  RRR Lungs:  CTAB Neck/HEME:  There are no carotid bruits bilaterally.  Neurological examination:  Orientation: The patient is alert and oriented x3.  Cranial nerves: There is good facial symmetry. Extraocular muscles are intact. The visual fields are full to confrontational testing. The speech is fluent and clear. Soft palate rises symmetrically and there is no tongue deviation. Hearing is intact to conversational tone. Sensation: Sensation is intact to light and pinprick throughout (facial, trunk, extremities). Vibration is intact at the bilateral big toe. There is no extinction with double simultaneous stimulation. There is no sensory dermatomal level identified. Motor: Strength is 5/5 in the bilateral upper and lower extremities.   Shoulder shrug is equal and symmetric.  There is no pronator drift. Deep tendon reflexes: Deep tendon reflexes are 2/4 at the bilateral biceps, triceps, brachioradialis, patella and achilles. Plantar responses are downgoing bilaterally.  Movement examination: Tone: There is mild increased tone in the LUE Abnormal movements: there is LUE rest tremor with distraction procedures Coordination:  There is mild decremation with RAM's, with any form of RAMS, including alternating supination and pronation  of the forearm, hand opening and closing, finger taps, heel taps and toe taps. Gait and Station: The patient has no difficulty arising out of a deep-seated chair without the use of the hands. The patient's stride length is good with good arm swing bilaterally.  The patient has a neg pull test.       Total time spent on today's visit was 60 minutes, including both face-to-face time and nonface-to-face time.  Time included that spent on review of records (prior notes available to me/labs/imaging if pertinent), discussing treatment and goals, answering patient's questions and coordinating care.  Cc:  , MD

## 2020-03-14 ENCOUNTER — Ambulatory Visit: Payer: BC Managed Care – PPO | Admitting: Neurology

## 2020-03-15 ENCOUNTER — Encounter: Payer: Self-pay | Admitting: Neurology

## 2020-03-15 ENCOUNTER — Other Ambulatory Visit: Payer: Self-pay

## 2020-03-15 ENCOUNTER — Ambulatory Visit (INDEPENDENT_AMBULATORY_CARE_PROVIDER_SITE_OTHER): Payer: BC Managed Care – PPO | Admitting: Neurology

## 2020-03-15 VITALS — BP 112/66 | HR 76 | Ht 68.0 in | Wt 222.0 lb

## 2020-03-15 DIAGNOSIS — F5089 Other specified eating disorder: Secondary | ICD-10-CM

## 2020-03-15 DIAGNOSIS — G20 Parkinson's disease: Secondary | ICD-10-CM

## 2020-03-15 NOTE — Patient Instructions (Signed)
Online Resources for Power over Parkinson's Group January 2022  . Local [place] Online Groups  o Power over Parkinson's Group :   - Power Over Parkinson's Patient Education Group will be Wednesday, January 12th at 2pm via Zoom.   - Upcoming Power over Parkinson's Meetings:  2nd Wednesdays of the month at 2 pm:       February 9th, March 9th - Contact [name] Marriott at [email or web address] if interested in participating in this online group o Parkinson's Care Partners Group:    3rd Mondays, Contact Sarah Chambers o Atypical Parkinsonian Patient Group:   4th Wednesdays, Contact Sarah Chambers o If you are interested in participating in these online groups with Sarah, please contact her directly for how to join those meetings.  Her contact information is [email or web address].  She will send you a link to join the Zoom meeting.  (Please note that Sarah Chambers , MSW, LCSW, has resigned her position at [facility] [employer], but will continue to lead the online groups temporarily)  . Parkinson Foundation:  www.parkinson.org o PD Health at Home continues:  Mindfulness Mondays, Expert Briefing Tuesdays, Wellness Wednesdays, Take Time Thursdays, Fitness Fridays -Listings for January 2022 are on the website o Upcoming Webinar:  Sights, Sounds, and Parkinson's.  Wednesday, April 04, 2020, @ 1 pm  o Please check out their website to sign up for emails and see their full online offerings  . [employer] Foundation:  www.[email or web address]  o Upcoming Webinar:   Diet, Exercise, and other Strategies for Living Well as you Age.  Thursday, March 24, 2020 @ 12 noon o Check out additional information on their website to see their full online offerings  . [employer] Phinney Foundation:  www.davisphinneyfoundation.org o Upcoming Webinar:  Emerging Therapies in Parkinson's with Dr. Michael Okun.  Wednesday, March 28, 2020 @ 2 pm o Care Partner Monthly Meetup.  With Connie Carpenter Phinney.  First  Tuesday of each month, 2 pm o Check out additional information to Live Well Today on their website  . Parkinson and Movement Disorders (PMD) Alliance:  www.pmdalliance.org o [employer]:  Online Education Events o Sign up for emails, which are sent weekly to give you updates on programming and online offerings  . Parkinson's Association of [place]:  www.parkinsonassociation.org o Information on online support groups, online exercises including Yoga, Parkinson's exercises and more-LOTS of information on links to PD resources and online events o Virtual Support Group through Parkinson's Association of [place]; next one is scheduled for Wednesday, April 04, 2020 at 2 pm. (These are typically scheduled for the 1st Wednesday of the month at 2 pm).  Visit website for details.  . Additional links for movement activities: o PWR! Moves Classes at [facility] HAD RESUMED (but are ON HOLD DUE TO COVID in January)  Contact [name] Marriott, PT [email or web address] or [phone] if interested o Here is a link to the PWR!Moves classes on Zoom from [place] Parkinson's Foundation - Daily Mon-Sat at 10:00. Via Zoom, FREE and open to all.  There is also a link below via Facebook if you use that platform. - https://www.parkinsonsmi.org/mpf-programs/exercise-and-movement-activities - https://www.facebook.com/ParkinsonsMI.org/posts/pwr-moves-exercise-class-parkinson-wellness-recovery-online-with-angee-ludwa-pt-/10156827878021813/ o Parkinson's Wellness Recovery (PWR! Moves)  www.pwr4life.org - Info on the PWR! Virtual Experience:  You will have access to our expertise through self-assessment, guided plans that start with the PD-specific fundamentals, educational content, tips, Q&A with an expert, and a growing library of PD-specific pre-recorded and live exercise classes of varying types and intensity - both physical and cognitive! If that is not   enough, we offer 1:1 wellness  consultations (in-person or virtual) to personalize your PWR! Virtual Experience.  - Check out the PWR! Move of the month on the Parkinson Wellness Recovery website:  https://www.pwr4life.org/pwr-move-of-the-month-4/ o Parkinson Foundation Fitness Fridays:  - As part of the PD Health @ Home program, this free video series focuses each week on one aspect of fitness designed to support people living with Parkinson's.  -  www.parkinson.org/understanding-parkinsons/coronavirus/PD-health-at-home/Fitness-Fridays o Dance for PD website is offering free, live-stream classes throughout the week, as well as links to digital library of classes:  https://danceforparkinsons.org/ o Dance for Parkinson's Class:  [employer] [place].  Free offering for people with Parkinson's and care partners; virtual class.  o For more information, contact [phone] or email Magalli Morana at [email or web address] o Virtual dance and Pilates for Parkinson's classes: Click on the Community Tab> Parkinson's Movement Initiative Tab.  To register for classes and for more information, visit www.americandancefestival.org and click the "community" tab.  o [employer] Parkinson's Cycling Classes  - [employer]: 1pm on Fridays-Live classes at [employer] (Contact Beth McKinney at [email or web address] or [phone]) - [employer]: Virtual Classes Mondays and Thursdays (contact Priscilla at [email or web address] or [phone])   o [place] [employer] - Three levels of classes are offered Tuesdays and Thursdays:  10:30 am,  12 noon & 1:45 pm at [employer] [facility]. To observe a class or for  more information, call [phone] or email [email or web address] - PD Flow Yoga for Parkinson's:  Fridays 1-2 pm, January 7-April 27, 2020 . [employer]: o Online Caregiver Education Opportunities:  www.well-springsolutions.org/caregiver-education/caregiver-support-group.  You  may also contact Jodi Kolada at [email or web address] or [phone].   o [employer] Powerful Tools for Caregivers, 6 week series designed to provide family and caregivers with practical tools to care for themselves and loved ones.  Wednesdays 10:15-12:15, beginning January 12th - Contact Jodi Kolada (above) for details o [employer] Navigator:  Just1Navigator program, a free service to help individuals and families through the journey of determining care for older adults.  The "Navigator" is a social worker, Nicole Reynolds, who will speak with a prospective client and/or loved ones to provide an assessment of the situation and a set of recommendations for a personalized care plan - all free of charge, and whether [employer] offers the needed service or not. If the need is not a service we provide, we are well-connected with reputable programs in town that we can refer you to.  www.well-springsolutions.org or to speak with the Navigator, call [phone].  

## 2020-03-27 DIAGNOSIS — U071 COVID-19: Secondary | ICD-10-CM | POA: Diagnosis not present

## 2020-04-16 ENCOUNTER — Encounter: Payer: Self-pay | Admitting: Neurology

## 2020-04-16 ENCOUNTER — Ambulatory Visit: Payer: BC Managed Care – PPO | Admitting: Neurology

## 2020-04-16 ENCOUNTER — Other Ambulatory Visit: Payer: Self-pay

## 2020-04-16 VITALS — BP 132/86 | HR 78 | Ht 68.0 in | Wt 220.0 lb

## 2020-04-16 DIAGNOSIS — G20 Parkinson's disease: Secondary | ICD-10-CM

## 2020-04-16 DIAGNOSIS — R635 Abnormal weight gain: Secondary | ICD-10-CM | POA: Diagnosis not present

## 2020-04-16 MED ORDER — CARBIDOPA-LEVODOPA 25-100 MG PO TABS: 1.0000 | ORAL_TABLET | Freq: Three times a day (TID) | ORAL | 5 refills | Status: DC

## 2020-04-16 MED ORDER — CARBIDOPA-LEVODOPA 25-100 MG PO TABS
1.0000 | ORAL_TABLET | Freq: Three times a day (TID) | ORAL | 5 refills | Status: DC
Start: 2020-04-16 — End: 2020-08-01

## 2020-04-16 NOTE — Progress Notes (Signed)
Subjective:    Patient ID: Wesley [name]. is a 60 y.o. male.  HPI     Interim history:   Wesley [name] is a 60 year old right-handed gentleman with an underlying medical history of hyperlipidemia, reflux disease, left hand pain and obesity, who presents for follow-up consultation of his left hand tremor and concern for parkinsonism, possible L sided PD.  The patient is unaccompanied today.  I last saw him on 12/08/2019, at which time he reported feeling about the same.  He was able to tolerate Requip 1 mg strength 1 pill 3 times daily.  He has not noticed any telltale results from it, did not think his tremor was any better.  I advised him to increase the ropinirole to 2 mg 3 times daily.  He had an interim appointment with Dr. Carles Collet on 03/15/2020 for second opinion of his Parkinson's disease.  Dr. Carles Collet concurred with the diagnosis and talk to him about his weight gain and compulsive overeating.  Levodopa therapy was also discussed.  Today, 04/16/20: He reports feeling about the same. He admits to snacking at night, cookies and milk typically.  He has it under control and has reduced it or omitted it completely.  He is working on weight loss.  He has not had any other side effects from the ropinirole and is tolerating it well, taking 2 pills 3 times a day, sometimes he misses the last dose.  Generally speaking, he takes it at 7 AM, second dose between 11 AM and 1 PM and last dose sometimes around 10 PM but it varies.  He has not fallen, no issues with constipation, has noticed ongoing issues with stiffness and aching in his left hand, tremor is about the same, does bother him when typing on the computer.  Sometimes the hand just jerks involuntarily.  He has noted the occasional jaw tremor.  He is trying to exercise.  He is working in the yard when possible as well.  He denies any dizziness, tries to hydrate well with water.  No difficulty swallowing.  The patient's allergies, current medications, family  history, past medical history, past social history, past surgical history and problem list were reviewed and updated as appropriate.    Previously:    I saw him on 09/07/2019, at which time he felt stable.  He was working on smoking cessation. He was also advised to cut back on his daily alcohol consumption. He was advised regarding his DaTscan results and encouraged to start Requip low-dose with gradual titration.  He reached out in the interim in August 2021 and reported that he had not noticed any telltale response.  He felt jittery and restless and was advised to increase the Requip to 1 mg 3 times daily.     I first met him on 08/04/2019, at the request of Marella Chimes, PA in rheumatology, at which time the patient reported an approximately 24-monthhistory of tremors affecting his left hand and stiffness and pain in the left wrist.  His findings were concerning for left-sided parkinsonism.  He was advised to proceed with a DaTscan for better diagnostic clarity.    He had a DaT scan on 08/31/19 and I reviewed the results: IMPRESSION: Loss of activity in the posterior striata and asymmetric decreased activity in the head of the RIGHT caudate nucleus. This pattern can be seen in Parkinsonian syndromes.   Of note, DaTSCAN is not diagnostic of Parkinsonian syndromes, which remains a clinical diagnosis. DaTscan is an adjuvant test  to aid in the clinical diagnosis of Parkinsonian syndromes.   We called him with the test result and arranged a FU appointment.      08/04/19: (He) reports an intermittent tremor affecting his left hand.  I reviewed your office note from 06/21/2019.   He has seen his primary care physician.  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 He has had an intermittent tremor for about 6 months and aching sensation and stiffness in the left hand.  He denies any recent falls.  He does not have any tremor in the lower body.  He does not have a family history of tremors or Parkinson's  disease.  He had an x-ray of the hand and wrist was tried on Mobic which did not provide any significant relief and also had a steroid shot.  He reports that his primary care physician told him it is not Parkinson's disease.  The patient reports some issue with his balance from time to time, no obvious changes in his walking or posture.  His wife has not noticed any obvious changes that she has mentioned.  He denies any sudden onset of one-sided weakness or numbness or tingling or droopy face or slurring of speech.  He feels that perhaps in the past 6 months he has had some more fatigue.  He works full-time as a Nurse, adult.  He smokes cigarettes daily, less than 1 pack/day.  He drinks alcohol daily about 2 glasses of wine per day.  He has never had a brain MRI or CT scan.    His Past Medical History Is Significant For: Past Medical History:  Diagnosis Date  . 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 Hyperlipidemia   . Primary parkinsonism ([place]) 08/2019   neurologist-- dr Rexene Alberts-- left sided w/ hand tremor    His Past Surgical History Is Significant For: Past Surgical History:  Procedure Laterality Date  . EXAMINATION UNDER ANESTHESIA  07/16/2011   Procedure: EXAM UNDER ANESTHESIA;  Surgeon: Marcello Moores A. Cornett, MD;  Location: [facility];  Service: General;  Laterality: N/A;  . EXAMINATION UNDER ANESTHESIA N/A 09/30/2012   Procedure: EXAM UNDER ANESTHESIA;  Surgeon: Leighton Ruff, MD;  Location: [facility];  Service: General;  Laterality: N/A;  . HIGH RESOLUTION ANOSCOPY N/A 09/30/2012   Procedure: HIGH RESOLUTION ANOSCOPY;  Surgeon: Leighton Ruff, MD;  Location: [facility];  Service: General;  Laterality: N/A;  . INGUINAL HERNIA REPAIR  1980'S  . LASER ABLATION CONDOLAMATA N/A 09/30/2012   Procedure: LASER ABLATION CONDOLAMATA;  Surgeon: Leighton Ruff, MD;  Location: [facility];  Service: General;  Laterality: N/A;  . LASER ABLATION CONDOLAMATA  N/A 02/02/2020   Procedure: LASER ABLATION CONDYLOMA;  Surgeon: Leighton Ruff, MD;  Location: [facility];  Service: General;  Laterality: N/A;  . RECTAL EXAM UNDER ANESTHESIA N/A 02/02/2020   Procedure: RECTAL EXAM UNDER ANESTHESIA;  Surgeon: Leighton Ruff, MD;  Location: [facility];  Service: General;  Laterality: N/A;  . WART FULGURATION  07/16/2011   Procedure: FULGURATION ANAL WART;  Surgeon: Joyice Faster. Cornett, MD;  Location: [place];  Service: General;  Laterality: N/A;    His Family History Is Significant For: Family History  Problem Relation Age of Onset  . Alzheimer's disease Mother   . Heart disease Father     His Social History Is Significant For: Social History   Socioeconomic History  . Marital status: Married    Spouse  name: Not on file  . Number of children: Not on file  . Years of education: Not on file  . Highest education level: Not on file  Occupational History  . Not on file  Tobacco Use  . Smoking status: Current Every Day Smoker    Packs/day: 1.00    Years: 35.00    Pack years: 35.00    Types: Cigarettes  . Smokeless tobacco: Never Used  . Tobacco comment: Counseled on reduce to smoke  Vaping Use  . Vaping Use: Never used  Substance and Sexual Activity  . Alcohol use: Yes    Alcohol/week: 14.0 standard drinks    Types: 14 Glasses of wine per week    Comment: 2 WINE PER DAY  . Drug use: Never  . Sexual activity: Not on file  Other Topics Concern  . Not on file  Social History Narrative  . Not on file   Social Determinants of Health   Financial Resource Strain: Not on file  Food Insecurity: Not on file  Transportation Needs: Not on file  Physical Activity: Not on file  Stress: Not on file  Social Connections: Not on file    His Allergies Are:  No Known Allergies:   His Current Medications Are:  Outpatient Encounter Medications as of 04/16/2020  Medication Sig  . acetaminophen (TYLENOL)  325 MG tablet Take 650 mg by mouth every 6 (six) hours as needed.  . carbidopa-levodopa (SINEMET IR) 25-100 MG tablet Take 1 tablet by mouth 3 (three) times daily. Follow instructions provided separately.  Marland Kitchen rOPINIRole (REQUIP) 1 MG tablet TAKE 2 TABLETS BY MOUTH 3 TIMES A DAY  . simvastatin (ZOCOR) 40 MG tablet Take 40 mg by mouth daily. AM   No facility-administered encounter medications on file as of 04/16/2020.  :  Review of Systems:  Out of a complete 14 point review of systems, all are reviewed and negative with the exception of these symptoms as listed below: Review of Systems  Neurological:       Here for f/u on PD. Reports tremors are the same. sts he has pain that is new in his left hand only. Pt reports the pain is located along his knuckles and has noticed some associated swelling. Pt is taking ropinirole 1 mg 2 tabs tid, reports he has not seen much benefit.     Objective:  Neurological Exam  Physical Exam Physical Examination:   Vitals:   04/16/20 0942  BP: 132/86  Pulse: 78  SpO2: 96%    General Examination: The patient is a very pleasant 60 y.o. male in no acute distress. He appears well-developed and well-nourished and well groomed.   HEENT:Normocephalic, atraumatic, pupils are equal, round and reactive to light, tracking is well preserved, no nystagmus is seen. He hasmild facial masking, mild to moderate nuchal rigidity, no significant hypophonia or dysarthria or jaw tremor or voice tremor. He has mild to mouth dryness, mild airway crowding, no obvious sialorrhea, tongue protrudes centrally and palate elevates symmetrically. Hearing is grossly intact.  Chest:Clear to auscultation without wheezing, rhonchi or crackles noted.  Heart:S1+S2+0, regular and normal without murmurs, rubs or gallops noted.   Abdomen:Soft, non-tender and non-distended with normal bowel sounds appreciated on auscultation.  Extremities:There isnopitting edema in the distal  lower extremities bilaterally.   Skin: Warm and dry without trophic changes noted.  Musculoskeletal: exam reveals no obvious joint deformities, tenderness or joint swelling or erythema.   Neurologically:  Mental status: The patient is awake, alert and  oriented in all 4 spheres.Hisimmediate and remote memory, attention, language skills and fund of knowledge are appropriate. There is no evidence of aphasia, agnosia, apraxia or anomia. Speech is clear with normal prosody and enunciation. Thought process is linear. Mood is normaland affect is normal.  (On Archimedes spiral drawing he has insecurity with his nondominant hand but no obvious trembling with either hand. Handwriting is legible, not particularly micrographic.)  He has a fairly consistent left hand resting tremor. He has no resting tremor in the right upper extremity or lower extremities. He has a slight postural tremor in the left more than right upper extremity, no significant action tremor.   Cranial nerves II - XII are as described above under HEENT exam. In addition: shoulder shrug is normal, but at baseline, right shoulder is slightly higher than left. Motor exam: Normal bulk,and strength is noted, tone is mildly increased in the left upper extremity, particularly in the wrist, with cogwheeling noted in the wrist and elbow on the left and slight increase in tone noted in the right upper extremity without obvious cogwheeling.  Fine motor skills are mildly impaired on the left, fairly normal on the right, foot agility is mildly impaired on the left and slightly impaired on the right.   He stands without difficulty, posture is mildly stooped for age, he walks with fairly good stride length and pace but decreased arm swing on the left.  Cerebellar testing: No dysmetria or intention tremor on finger to nose testing. Heel to shin is unremarkable bilaterally. There is no truncal or gait ataxia.  Sensory exam: intact to light  touch.  Assessmentand Plan:   [place] a very pleasant 45 year oldmalewith an underlying medical history of hyperlipidemia, reflux disease, left hand pain and obesity, whopresents forfollow-up consultation of his left sided predominant Parkinson's disease with symptoms dating back to about a year ago.  His DaTscan in June 2021 was supportive for an underlying parkinsonian syndrome.  He has been on ropinirole since July 2021.  This was increased to 2 mg 3 times daily in October 2021.  He had a recent appointment with Dr. Carles Collet at [facility] neurology for second opinion.  He was noted to have weight increase and increased snacking, concern for compulsive eating.  We talked about this at length today.  He feels like he has it under control.  He was snacking on cookies and milk at night.  He is working on weight loss.  He is otherwise tolerating the ropinirole but has not had any telltale results even on the 2 mg 3 times daily regimen.  He is advised to continue with this for now and add levodopa and low-dose with gradual titration.  We will start with 25-100 mg strength half a pill 3 times daily.  We talked about the importance of taking the medication on time and the levodopa away from mealtimes.  He is advised to take the levodopa as well as the ropinirole 3 times a day, at 7 AM, 12 and 6 PM daily.  He is advised to follow-up routinely in 3 months, sooner if needed.  I answered all his questions today and he was in agreement with the plan.  He was given detailed written instructions as well.  I spent 30 minutes in total face-to-face time and in reviewing records during pre-charting, more than 50% of which was spent in counseling and coordination of care, reviewing test results, reviewing medications and treatment regimen and/or in discussing or reviewing the  diagnosis of PD, the prognosis and treatment options. Pertinent laboratory and imaging test results that were available during  this visit with the patient were reviewed by me and considered in my medical decision making (see chart for details).

## 2020-04-16 NOTE — Patient Instructions (Addendum)
It was good to see you again today.   Please monitor your weight.  You have indeed gained some weight since June 2021, in the realm of 15 pounds.  Please monitor for nighttime and unhealthy snacking.  Sometimes patients can develop compulsive behaviors when on a dopamine agonist medication such as the ropinirole.  For now, we can try to monitor things and continue with the ropinirole 2 mg 3 times a day, namely at 7 AM, noon, and 6 PM daily As discussed, at this time, we will add a second medication, called Sinemet (generic name: carbidopa-levodopa) 25/100 mg:   You can take the Sinemet and the ropinirole together.  Please take your medications on a scheduled basis and try to stay with them to schedule.  The Sinemet is more sensitive to food intake.  Please take half a pill 3 times a day for the first week, at 7 AM, 12, and 6 PM daily.  After 1 week you can increase it to 1 full pill 3 times a day.   Please try to take the medication away from you mealtimes, that is, ideally either one hour before or 2 hours after your meal to ensure optimal absorption. The medication can interfere with the protein content of your meal and trying to the protein in your food and therefore not get fully absorbed.  Common side effects reported are: Nausea, vomiting, sedation, confusion, lightheadedness. Rare side effects include hallucinations, severe nausea or vomiting, diarrhea and significant drop in blood pressure especially when going from lying to standing or from sitting to standing.   Continue to monitor your weight and work on weight loss, continue to try to exercise on a regular basis.  Try to hydrate well with water and monitor for constipation issues.  Please follow-up in 3 months, sooner if needed.

## 2020-06-04 DIAGNOSIS — L819 Disorder of pigmentation, unspecified: Secondary | ICD-10-CM | POA: Diagnosis not present

## 2020-06-04 DIAGNOSIS — B078 Other viral warts: Secondary | ICD-10-CM | POA: Diagnosis not present

## 2020-06-04 DIAGNOSIS — D229 Melanocytic nevi, unspecified: Secondary | ICD-10-CM | POA: Diagnosis not present

## 2020-06-04 DIAGNOSIS — L814 Other melanin hyperpigmentation: Secondary | ICD-10-CM | POA: Diagnosis not present

## 2020-06-04 DIAGNOSIS — L821 Other seborrheic keratosis: Secondary | ICD-10-CM | POA: Diagnosis not present

## 2020-07-11 DIAGNOSIS — B078 Other viral warts: Secondary | ICD-10-CM | POA: Diagnosis not present

## 2020-07-16 ENCOUNTER — Ambulatory Visit: Payer: BC Managed Care – PPO | Admitting: Neurology

## 2020-08-01 ENCOUNTER — Encounter: Payer: Self-pay | Admitting: Neurology

## 2020-08-01 ENCOUNTER — Other Ambulatory Visit: Payer: Self-pay

## 2020-08-01 ENCOUNTER — Ambulatory Visit: Payer: BC Managed Care – PPO | Admitting: Neurology

## 2020-08-01 VITALS — BP 130/83 | HR 73 | Ht 68.0 in | Wt 219.0 lb

## 2020-08-01 DIAGNOSIS — R635 Abnormal weight gain: Secondary | ICD-10-CM

## 2020-08-01 DIAGNOSIS — K5900 Constipation, unspecified: Secondary | ICD-10-CM | POA: Diagnosis not present

## 2020-08-01 DIAGNOSIS — G20 Parkinson's disease: Secondary | ICD-10-CM | POA: Diagnosis not present

## 2020-08-01 MED ORDER — CARBIDOPA-LEVODOPA 25-100 MG PO TABS
1.0000 | ORAL_TABLET | Freq: Three times a day (TID) | ORAL | 3 refills | Status: DC
Start: 2020-08-01 — End: 2020-08-01

## 2020-08-01 MED ORDER — ROPINIROLE HCL 1 MG PO TABS
2.0000 mg | ORAL_TABLET | Freq: Three times a day (TID) | ORAL | 3 refills | Status: DC
Start: 2020-08-01 — End: 2021-09-05

## 2020-08-01 MED ORDER — CARBIDOPA-LEVODOPA 25-100 MG PO TABS: 1.0000 | ORAL_TABLET | Freq: Three times a day (TID) | ORAL | 3 refills | Status: DC

## 2020-08-01 NOTE — Patient Instructions (Addendum)
It was good to see you again today.  As discussed, we will keep your medications the same.  Please try to be consistent with your medications and make yourself a cell phone reminder for that dose times, try not to skip any doses.  You may need to take a stool softener, even an over the counter Laxative if needed.  Please try to be proactive about constipation as it can become a problem in Parkinson's disease.  Increase your natural fiber intake by adding more fruit and vegetable, dry food such as prunes, figs or apricots.  You can also restart Benefiber or Metamucil.  Stay well-hydrated with water.  Try to exercise more as physical activity also combats constipation.  Think about a gym membership or adding a recumbent bike to your home exercise tools.  Follow-up routinely in 4 months, sooner if needed.

## 2020-08-01 NOTE — Progress Notes (Signed)
Subjective:    Patient ID: Wesley [name]. is a 60 y.o. male.  HPI     Interim history:   [name] is a 60 year old right-handed gentleman with an underlying medical history of hyperlipidemia, reflux disease, left hand pain and obesity, who presents for follow-up consultation of his left hand tremor and concern for parkinsonism, possible L sided PD.  The patient is unaccompanied today.  I last saw him on 04/16/2020, at which time he was taking ropinirole 2 pills 3 times daily.  He admitted to snacking on cookies and milk.  He had gained weight in the realm of 15 pounds.  He had seen Dr. Carles Collet for second opinion in January 2022.  He was advised to monitor his symptoms and his weight, we decided to continue with ropinirole 2 mg strength 3 times a day.  He was advised to start Sinemet with gradual titration.  Today, 08/01/2020: He reports feeling more or less the same.  Tremor is about the same.  He tolerates the levodopa and has not noticed a telltale response from it.  He is fatigued more, sometimes he naps in.  the afternoons.  He tries to be consistent with his medications, takes the first dose at 630, has breakfast around 9 or 10, second dose around 12 but sometimes forgets it at work and, has lunch around 130 or 2 PM and takes the last dose around 8 PM but may forget it.  He has not had any falls, no orthostatic lightheadedness.  Sleeps fairly well and has no cognitive concerns.  He does feel slightly better in the mornings but by the afternoon he gets very fatigued and exhausted.  He does try to do weights about every other day and tries to do about 50 sit ups every day, but has not done much in the way of walking or bicycling or cardio.   He has noticed issues with constipation.  He typically has never had any issues before.  He used to take Benefiber regularly but has not done so lately.   He hydrates well with water.   The patient's allergies, current medications, family history, past medical  history, past social history, past surgical history and problem list were reviewed and updated as appropriate.    Previously:      I saw him on 12/08/2019, at which time he reported feeling about the same.  He was able to tolerate Requip 1 mg strength 1 pill 3 times daily.  He has not noticed any telltale results from it, did not think his tremor was any better.  I advised him to increase the ropinirole to 2 mg 3 times daily.   He had an interim appointment with Dr. Carles Collet on 03/15/2020 for second opinion of his Parkinson's disease.  Dr. Carles Collet concurred with the diagnosis and talk to him about his weight gain and compulsive overeating.  Levodopa therapy was also discussed.     I saw him on 09/07/2019, at which time he felt stable.  He was working on smoking cessation. He was also advised to cut back on his daily alcohol consumption. He was advised regarding his DaTscan results and encouraged to start Requip low-dose with gradual titration.  He reached out in the interim in August 2021 and reported that he had not noticed any telltale response.  He felt jittery and restless and was advised to increase the Requip to 1 mg 3 times daily.     I first met him on 08/04/2019, at  the request of Marella Chimes, PA in rheumatology, at which time the patient reported an approximately 60-monthhistory of tremors affecting his left hand and stiffness and pain in the left wrist.  His findings were concerning for left-sided parkinsonism.  He was advised to proceed with a DaTscan for better diagnostic clarity.    He had a DaT scan on 08/31/19 and I reviewed the results: IMPRESSION: Loss of activity in the posterior striata and asymmetric decreased activity in the head of the RIGHT caudate nucleus. This pattern can be seen in Parkinsonian syndromes.   Of note, DaTSCAN is not diagnostic of Parkinsonian syndromes, which remains a clinical diagnosis. DaTscan is an adjuvant test to aid in the clinical diagnosis of Parkinsonian  syndromes.   We called him with the test result and arranged a FU appointment.      08/04/19: (He) reports an intermittent tremor affecting his left hand.  I reviewed your office note from 06/21/2019.   He has seen his primary care physician.  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 He has had an intermittent tremor for about 6 months and aching sensation and stiffness in the left hand.  He denies any recent falls.  He does not have any tremor in the lower body.  He does not have a family history of tremors or Parkinson's disease.  He had an x-ray of the hand and wrist was tried on Mobic which did not provide any significant relief and also had a steroid shot.  He reports that his primary care physician told him it is not Parkinson's disease.  The patient reports some issue with his balance from time to time, no obvious changes in his walking or posture.  His wife has not noticed any obvious changes that she has mentioned.  He denies any sudden onset of one-sided weakness or numbness or tingling or droopy face or slurring of speech.  He feels that perhaps in the past 6 months he has had some more fatigue.  He works full-time as a lNurse, adult  He smokes cigarettes daily, less than 1 pack/day.  He drinks alcohol daily about 2 glasses of wine per day.  He has never had a brain MRI or CT scan.      His Past Medical History Is Significant For: Past Medical History:  Diagnosis Date  . 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 Hyperlipidemia   . Primary parkinsonism ([facility] 08/[place]   neurologist-- dr aRexene Alberts- left sided w/ hand tremor    His Past Surgical History Is Significant For: Past Surgical History:  Procedure Laterality Date  . EXAMINATION UNDER ANESTHESIA  07/16/2011   Procedure: EXAM UNDER ANESTHESIA;  Surgeon: TMarcello MooresA. Cornett, MD;  [facility] [facility]  Service: General;  Laterality: N/A;  . EXAMINATION UNDER ANESTHESIA N/A 09/30/2012   Procedure: EXAM UNDER ANESTHESIA;  Surgeon:  ALeighton Ruff MD;  [place] [facility]  Service: General;  Laterality: N/A;  . HIGH RESOLUTION ANOSCOPY N/A 09/30/2012   Procedure: HIGH RESOLUTION ANOSCOPY;  Surgeon: ALeighton Ruff MD;  [place] [facility]  Service: General;  Laterality: N/A;  . INGUINAL HERNIA REPAIR  1980'S  . LASER ABLATION CONDOLAMATA N/A 09/30/2012   Procedure: LASER ABLATION CONDOLAMATA;  Surgeon: ALeighton Ruff MD;  [facility]  Service: General;  Laterality: N/A;  . LASER ABLATION CONDOLAMATA N/A 02/02/2020   Procedure: LASER ABLATION CONDYLOMA;  Surgeon: TLeighton Ruff MD;  [facility]  Service:  General;  Laterality: N/A;  . RECTAL EXAM UNDER ANESTHESIA N/A 02/02/2020   Procedure: RECTAL EXAM UNDER ANESTHESIA;  Surgeon: Leighton Ruff, MD;  Location[facility];  Service: General;  Laterality: N/A;  . WART FULGURATION  07/16/2011   Procedure: FULGURATION ANAL WART;  Surgeon: Joyice Faster. Cornett, MD;  Location: [place];  Service: General;  Laterality: N/A;    His Family History Is Significant For: Family History  Problem Relation Age of Onset  . Alzheimer's disease Mother   . Heart disease Father     His Social History Is Significant For: Social History   Socioeconomic History  . Marital status: Married    Spouse name: Not on file  . Number of children: Not on file  . Years of education: Not on file  . Highest education level: Not on file  Occupational History  . Not on file  Tobacco Use  . Smoking status: Current Every Day Smoker    Packs/day: 1.00    Years: 35.00    Pack years: 35.00    Types: Cigarettes  . Smokeless tobacco: Never Used  . Tobacco comment: Counseled on reduce to smoke  Vaping Use  . Vaping Use: Never used  Substance and Sexual Activity  . Alcohol use: Yes    Alcohol/week: 14.0 standard drinks    Types: 14 Glasses of wine per week    Comment: 2 WINE PER DAY  .  Drug use: Never  . Sexual activity: Not on file  Other Topics Concern  . Not on file  Social History Narrative  . Not on file   Social Determinants of Health   Financial Resource Strain: Not on file  Food Insecurity: Not on file  Transportation Needs: Not on file  Physical Activity: Not on file  Stress: Not on file  Social Connections: Not on file    His Allergies Are:  No Known Allergies:   His Current Medications Are:  Outpatient Encounter Medications as of 08/01/2020  Medication Sig  . acetaminophen (TYLENOL) 325 MG tablet Take 650 mg by mouth every 6 (six) hours as needed.  . carbidopa-levodopa (SINEMET IR) 25-100 MG tablet Take 1 tablet by mouth 3 (three) times daily. Follow instructions provided separately.  Marland Kitchen rOPINIRole (REQUIP) 1 MG tablet TAKE 2 TABLETS BY MOUTH 3 TIMES A DAY  . simvastatin (ZOCOR) 40 MG tablet Take 40 mg by mouth daily. AM  . Cholecalciferol (VITAMIN D3) 50 MCG (2000 UT) capsule  (Patient not taking: Reported on 08/01/2020)   No facility-administered encounter medications on file as of 08/01/2020.  :  Review of Systems:  Out of a complete 14 point review of systems, all are reviewed and negative with the exception of these symptoms as listed below: Review of Systems  Neurological:       Pt here for follow up. Overall stable and no concerns.    Objective:  Neurological Exam  Physical Exam Physical Examination:   Vitals:   08/01/20 0726  BP: 130/83  Pulse: 73    General Examination: The patient is a very pleasant 60 y.o. male in no acute distress. He appears well-developed and well-nourished and well groomed.    HEENT:Normocephalic, atraumatic, pupils are equal, round and reactive to light,tracking is well preserved, no nystagmus is seen. He hasmild facial masking, mild to moderate nuchal rigidity, no significant hypophonia or dysarthria or jaw tremor or voice tremor. He has mild to mouth dryness, mild airway crowding, no obvious  sialorrhea, tongue protrudes  centrally and palate elevates symmetrically. Hearing is grossly intact.  Chest:Clear to auscultation without wheezing, rhonchi or crackles noted.  Heart:S1+S2+0, regular and normal without murmurs, rubs or gallops noted.   Abdomen:Soft, non-tender and non-distended with normal bowel sounds appreciated on auscultation.  Extremities:There isnopitting edema in the distal lower extremities bilaterally.   Skin: Warm and dry without trophic changes noted.  Musculoskeletal: exam reveals no obvious joint deformities, tenderness or joint swelling or erythema.   Neurologically:  Mental status: The patient is awake, alert and oriented in all 4 spheres.Hisimmediate and remote memory, attention, language skills and fund of knowledge are appropriate. There is no evidence of aphasia, agnosia, apraxia or anomia. Speech is clear with normal prosody and enunciation. Thought process is linear. Mood is normaland affect is normal.  (On Archimedes spiral drawing he has insecurity with his nondominant hand but no obvious trembling with either hand. Handwriting is legible, not particularly micrographic.)  He has a fairly consistent left hand resting tremor. He has no resting tremor in the right upper extremity or lower extremities. He has a slight postural tremor in the left more than right upper extremity, no significant action tremor.   Cranial nerves II - XII are as described above under HEENT exam. In addition: shoulder shrug is normal, but at baseline, right shoulder is slightly higher than left. Motor exam: Normal bulk,and strength is noted, tone is mildly increased in the left upper extremity, particularly in the wrist, with cogwheeling noted in the wrist and elbow on the left and slight increase in tone noted in the right upper extremity without obvious cogwheeling.  Fine motor skills are mildly impaired on the left, fairly normal on the right, foot  agility is mildly impaired on the left and slightly impaired on the right.   He stands without difficulty, posture is mildly stooped for age, he walks with fairly good stride length and pace but decreased arm swing on the left.  Cerebellar testing: No dysmetria or intention tremor on finger to nose testing. Heel to shin is unremarkable bilaterally. There is no truncal or gait ataxia.  Sensory exam: intact to light touch.  Assessmentand Plan:   In [place] a very pleasant 34 year oldmalewith an underlying medical history of hyperlipidemia, reflux disease, left hand pain and obesity, whopresents forfollow-up consultation of his left sided predominant Parkinson's disease with symptoms dating back to early 2021. His DaTscan in June 2021 was supportive for an underlying parkinsonian syndrome.  He has been on ropinirole since July 2021.  This was increased to 2 mg 3 times daily in October 2021.  He had an appointment with Dr. Carles Collet at [facility] neurology for second opinion in January 2022.  He was noted to have weight increase and increased snacking, concern for compulsive eating.  We talked about this at length today.  He feels like he has it under control.  He was snacking on cookies and milk at night.   He is otherwise tolerating the ropinirole but has not had any telltale results even on the 2 mg 3 times daily regimen.  He is advised to continue with this for now and we added levodopa in low-dose with gradual titration in February 2022.  He is tolerating this, has not had a telltale improvement in particularly his tremor.  Nevertheless, he is advised to continue with his regimen.  He is advised to be consistent with his medications and not skip any doses, try to make cell phone alarms for all the 3  doses.  We talked about the importance of taking the medication on time and the levodopa away from mealtimes.    We talked about the importance of healthy lifestyle.  Today, we mutually  agreed to keep his medication regimen the same but focus more on lifestyle modification, exercise, constipation control.  He is advised to restart taking his Benefiber or fiber supplement of choice, increase his natural fiber intake by eating fruit and vegetables and also dry fruit if possible.  He is advised to stay well-hydrated with water and try to exercise in the form of cardio, walking, stationary bike would be good choices for this.  He is advised to continue with strength exercises.  He is advised to monitor his weight and snacking.  We will continue to monitor his exam.  I renewed his prescriptions for 90 days for the levodopa and ropinirole.  He is advised to follow-up routinely in 4 months, sooner if needed.  I answered all his questions today and he was in agreement.  I spent 30 minutes in total face-to-face time and in reviewing records during pre-charting, more than 50% of which was spent in counseling and coordination of care, reviewing test results, reviewing medications and treatment regimen and/or in discussing or reviewing the diagnosis of PD, the prognosis and treatment options. Pertinent laboratory and imaging test results that were available during this visit with the patient were reviewed by me and considered in my medical decision making (see chart for details).

## 2020-12-03 ENCOUNTER — Ambulatory Visit: Payer: BC Managed Care – PPO | Admitting: Neurology

## 2020-12-03 ENCOUNTER — Telehealth: Payer: Self-pay | Admitting: *Deleted

## 2020-12-03 ENCOUNTER — Encounter: Payer: Self-pay | Admitting: Neurology

## 2020-12-03 NOTE — Telephone Encounter (Signed)
Pt no showed OV with Dr Frances Furbish this AM. This is his first no-show with [facility].

## 2020-12-31 DIAGNOSIS — A630 Anogenital (venereal) warts: Secondary | ICD-10-CM | POA: Diagnosis not present

## 2021-01-03 DIAGNOSIS — E782 Mixed hyperlipidemia: Secondary | ICD-10-CM | POA: Diagnosis not present

## 2021-01-03 DIAGNOSIS — K429 Umbilical hernia without obstruction or gangrene: Secondary | ICD-10-CM | POA: Diagnosis not present

## 2021-01-03 DIAGNOSIS — Z125 Encounter for screening for malignant neoplasm of prostate: Secondary | ICD-10-CM | POA: Diagnosis not present

## 2021-01-03 DIAGNOSIS — Z23 Encounter for immunization: Secondary | ICD-10-CM | POA: Diagnosis not present

## 2021-01-03 DIAGNOSIS — Z13220 Encounter for screening for lipoid disorders: Secondary | ICD-10-CM | POA: Diagnosis not present

## 2021-01-03 DIAGNOSIS — E559 Vitamin D deficiency, unspecified: Secondary | ICD-10-CM | POA: Diagnosis not present

## 2021-01-03 DIAGNOSIS — Z0000 Encounter for general adult medical examination without abnormal findings: Secondary | ICD-10-CM | POA: Diagnosis not present

## 2021-02-20 ENCOUNTER — Ambulatory Visit: Payer: BC Managed Care – PPO | Admitting: Neurology

## 2021-03-07 ENCOUNTER — Encounter: Payer: Self-pay | Admitting: *Deleted

## 2021-03-07 ENCOUNTER — Encounter: Payer: Self-pay | Admitting: Neurology

## 2021-03-07 ENCOUNTER — Ambulatory Visit: Payer: BC Managed Care – PPO | Admitting: Neurology

## 2021-03-07 VITALS — BP 110/72 | HR 64 | Ht 68.0 in | Wt 227.2 lb

## 2021-03-07 DIAGNOSIS — K5900 Constipation, unspecified: Secondary | ICD-10-CM | POA: Diagnosis not present

## 2021-03-07 DIAGNOSIS — H538 Other visual disturbances: Secondary | ICD-10-CM | POA: Diagnosis not present

## 2021-03-07 DIAGNOSIS — G20 Parkinson's disease: Secondary | ICD-10-CM | POA: Diagnosis not present

## 2021-03-07 DIAGNOSIS — R635 Abnormal weight gain: Secondary | ICD-10-CM | POA: Diagnosis not present

## 2021-03-07 MED ORDER — CARBIDOPA-LEVODOPA 25-100 MG PO TABS: 1.0000 | ORAL_TABLET | Freq: Four times a day (QID) | ORAL | 3 refills | Status: DC

## 2021-03-07 NOTE — Progress Notes (Deleted)
f °

## 2021-03-07 NOTE — Progress Notes (Signed)
Subjective:    Patient ID: Wesley [name]. is a 61 y.o. male.  HPI   Interim history:   Wesley [name] is a 61 year old right-handed gentleman with an underlying medical history of hyperlipidemia, reflux disease, left hand pain and obesity, who presents for follow-up consultation of his left sided PD.  The patient is unaccompanied today. He missed an appointment on 12/03/20. I last saw him on 08/01/2020, at which time we talked about the importance of healthy lifestyle and constipation management.  He had gained weight secondary to excessive snacking.  We agreed to monitor this.  He was advised to continue with ropinirole and levodopa but encouraged to be very consistent with his medication regimen.    Today, 03/07/2021: He reports that he feels fairly stable but he does worry about progression.  He has not fallen, constipation is intermittent, he is sometimes takes a fiber supplement in his coffee.  He limits his coffee to 1 cup in the morning.  He has had some weight gain and in part this could be secondary to a change in his job, he has a more sedentary job since March 2021.  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  He has had no issues driving.  He has not actually utilized his CDL in about 2 to 3 years.  He has had intermittent blurry vision and some double vision, has not had an eye examination in 4 to 5 years, he has seen optometrist.  He has had ongoing issues with trembling of the left upper extremity and also in his jaw, sometimes the tremor is more noticeable than others and is bothersome.  He has not seen any telltale trembling or his symptoms on his right side.  He is apologetic for missing an appointment last year, he simply forgot about it.  He had a physical with his primary care and the following and his vitamin D was low, he has not consistently supplemented with vitamin D.  The patient's allergies, current  medications, family history, past medical history, past social history, past surgical history and problem list were reviewed and updated as appropriate.    Previously:    I saw him on 04/16/2020, at which time he was taking ropinirole 2 pills 3 times daily.  He admitted to snacking on cookies and milk.  He had gained weight in the realm of 15 pounds.  He had seen Dr. Carles Collet for second opinion in January 2022.  He was advised to monitor his symptoms and his weight, we decided to continue with ropinirole 2 mg strength 3 times a day.  He was advised to start Sinemet with gradual titration.    I saw him on 12/08/2019, at which time he reported feeling about the same.  He was able to tolerate Requip 1 mg strength 1 pill 3 times daily.  He has not noticed any telltale results from it, did not think his tremor was any better.  I advised him to increase the ropinirole to 2 mg 3 times daily.   He had an interim appointment with Dr. Carles Collet on 03/15/2020 for second opinion of his Parkinson's disease.  Dr. Carles Collet concurred with the diagnosis and talk to him about his weight gain and compulsive overeating.  Levodopa therapy was also discussed.     I saw him on 09/07/2019, at which time he felt stable.  He was working on  smoking cessation. He was also advised to cut back on his daily alcohol consumption. He was advised regarding his DaTscan results and encouraged to start Requip low-dose with gradual titration.  He reached out in the interim in August 2021 and reported that he had not noticed any telltale response.  He felt jittery and restless and was advised to increase the Requip to 1 mg 3 times daily.     I first met him on 08/04/2019, at the request of Marella Chimes, PA in rheumatology, at which time the patient reported an approximately 54-monthhistory of tremors affecting his left hand and stiffness and pain in the left wrist.  His findings were concerning for left-sided parkinsonism.  He was advised to proceed with a DaTscan  for better diagnostic clarity.    He had a DaT scan on 08/31/19 and I reviewed the results: IMPRESSION: Loss of activity in the posterior striata and asymmetric decreased activity in the head of the RIGHT caudate nucleus. This pattern can be seen in Parkinsonian syndromes.   Of note, DaTSCAN is not diagnostic of Parkinsonian syndromes, which remains a clinical diagnosis. DaTscan is an adjuvant test to aid in the clinical diagnosis of Parkinsonian syndromes.   We called him with the test result and arranged a FU appointment.      08/04/19: (He) reports an intermittent tremor affecting his left hand.  I reviewed your office note from 06/21/2019.   He has seen his primary care physician.  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 He has had an intermittent tremor for about 6 months and aching sensation and stiffness in the left hand.  He denies any recent falls.  He does not have any tremor in the lower body.  He does not have a family history of tremors or Parkinson's disease.  He had an x-ray of the hand and wrist was tried on Mobic which did not provide any significant relief and also had a steroid shot.  He reports that his primary care physician told him it is not Parkinson's disease.  The patient reports some issue with his balance from time to time, no obvious changes in his walking or posture.  His wife has not noticed any obvious changes that she has mentioned.  He denies any sudden onset of one-sided weakness or numbness or tingling or droopy face or slurring of speech.  He feels that perhaps in the past 6 months he has had some more fatigue.  He works full-time as a lNurse, adult  He smokes cigarettes daily, less than 1 pack/day.  He drinks alcohol daily about 2 glasses of wine per day.  He has never had a brain MRI or CT scan.      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   Hyperlipidemia    Primary parkinsonism  ([facility] 08/2019   neurologist-- dr aRexene Alberts- left sided w/ hand tremor    His Past Surgical History Is Significant For: Past Surgical History:  Procedure Laterality Date   EXAMINATION UNDER ANESTHESIA  07/16/2011   Procedure: EXAM UNDER ANESTHESIA;  Surgeon: TJoyice Faster Cornett, MD;  Location[place] [facility]  Service: General;  Laterality: N/A;   EXAMINATION UNDER ANESTHESIA N/A 09/30/2012   Procedure: EXAM UNDER ANESTHESIA;  Surgeon: ALeighton Ruff MD;  [facility]  Service: General;  Laterality: N/A;   HIGH RESOLUTION ANOSCOPY N/A 09/30/2012   Procedure: HIGH RESOLUTION ANOSCOPY;  Surgeon: Leighton Ruff, MD;  Location: [facility];  Service: General;  Laterality: N/A;   INGUINAL HERNIA REPAIR  1980'S   LASER ABLATION CONDOLAMATA N/A 09/30/2012   Procedure: LASER ABLATION CONDOLAMATA;  Surgeon: Leighton Ruff, MD;  Location: [place];  Service: General;  Laterality: N/A;   [place] N/A 02/02/2020   Procedure: LASER ABLATION CONDYLOMA;  Surgeon: Leighton Ruff, MD;  Location: [address];  Service: General;  Laterality: N/A;   RECTAL EXAM UNDER ANESTHESIA N/A 02/02/2020   Procedure: RECTAL EXAM UNDER ANESTHESIA;  Surgeon: Leighton Ruff, MD;  Location: [facility];  Service: General;  Laterality: N/A;   WART FULGURATION  07/16/2011   Procedure: FULGURATION ANAL WART;  Surgeon: Joyice Faster. Cornett, MD;  Location: [place];  Service: General;  Laterality: N/A;    His Family History Is Significant For: Family History  Problem Relation Age of Onset   Alzheimer's disease Mother    Heart disease Father     His Social History Is Significant For: Social History   Socioeconomic History   Marital status: Married    Spouse name: Not on file   Number of children: Not on file   Years of education: Not on file   Highest education level: Not on file  Occupational History    Not on file  Tobacco Use   Smoking status: Every Day    Packs/day: 1.00    Years: 35.00    Pack years: 35.00    Types: Cigarettes   Smokeless tobacco: Never   Tobacco comments:    Counseled on reduce to smoke  Vaping Use   Vaping Use: Never used  Substance and Sexual Activity   Alcohol use: Yes    Alcohol/week: 14.0 standard drinks    Types: 14 Glasses of wine per week    Comment: 2 WINE PER DAY   Drug use: Never   Sexual activity: Not on file  Other Topics Concern   Not on file  Social History Narrative   Not on file   Social Determinants of Health   Financial Resource Strain: Not on file  Food Insecurity: Not on file  Transportation Needs: Not on file  Physical Activity: Not on file  Stress: Not on file  Social Connections: Not on file    His Allergies Are:  No Known Allergies:   His Current Medications Are:  Outpatient Encounter Medications as of 03/07/2021  Medication Sig   acetaminophen (TYLENOL) 325 MG tablet Take 650 mg by mouth every 6 (six) hours as needed.   rOPINIRole (REQUIP) 1 MG tablet Take 2 tablets (2 mg total) by mouth 3 (three) times daily.   simvastatin (ZOCOR) 40 MG tablet Take 40 mg by mouth daily. AM   [DISCONTINUED] carbidopa-levodopa (SINEMET IR) 25-100 MG tablet Take 1 tablet by mouth 3 (three) times daily. Follow instructions provided separately.   carbidopa-levodopa (SINEMET IR) 25-100 MG tablet Take 1 tablet by mouth 4 (four) times daily. Follow instructions provided separately.   Cholecalciferol (VITAMIN D3) 50 MCG (2000 UT) capsule  (Patient not taking: Reported on 08/01/2020)   No facility-administered encounter medications on file as of 03/07/2021.  :  Review of Systems:  Out of a complete 14 point review of systems, all are reviewed and negative with the exception of these symptoms as listed below:  Review of Systems  Neurological:        He states feels some worse, has teeth chatter when arms  folded.  Also L hand, middle fingers stiff  (pain).  Mentioned blurry vision/ double vision at times.  Has not had eyes checked.     Objective:  Neurological Exam  Physical Exam Physical Examination:   Vitals:   03/07/21 0830  BP: 110/72  Pulse: 64    General Examination: The patient is a very pleasant 61 y.o. male in no acute distress. He appears well-developed and well-nourished and well groomed.   HEENT: Normocephalic, atraumatic, pupils are equal, round and reactive to light, tracking is well preserved, no nystagmus is seen. He has mild facial masking, mild to moderate nuchal rigidity, no significant hypophonia or dysarthria.  He has an intermittent lower jaw tremor, no significant voice tremor.  Perhaps slight raspiness in his voice at times. He has mild to mouth dryness, mild airway crowding, no obvious sialorrhea, tongue protrudes centrally and palate elevates symmetrically.  Hearing is grossly intact.   Chest: Clear to auscultation without wheezing, rhonchi or crackles noted.   Heart: S1+S2+0, regular and normal without murmurs, rubs or gallops noted.    Abdomen: Soft, non-tender and non-distended.   Extremities: There is no pitting edema in the distal lower extremities bilaterally.    Skin: Warm and dry without trophic changes noted.   Musculoskeletal: exam reveals no obvious joint deformities, but reports stiffness in his left hand.    Neurologically:  Mental status: The patient is awake, alert and oriented in all 4 spheres. His immediate and remote memory, attention, language skills and fund of knowledge are appropriate. There is no evidence of aphasia, agnosia, apraxia or anomia. Speech is clear with normal prosody and enunciation. Thought process is linear. Mood is normal and affect is normal.     (On Archimedes spiral drawing he has insecurity with his nondominant hand but no obvious trembling with either hand. Handwriting is legible, not particularly micrographic.)   He has a fairly consistent left hand  resting tremor.  He has no resting tremor in the right upper extremity or lower extremities. He has a slight postural tremor in the left more than right upper extremity, no significant action tremor.     Cranial nerves II - XII are as described above under HEENT exam. In addition: shoulder shrug is normal, but at baseline, right shoulder is slightly higher than left. Motor exam: Normal bulk, and strength is noted, tone is mildly increased in the left upper extremity, particularly in the wrist, with cogwheeling noted in the wrist and elbow on the left and slight increase in tone noted in the right upper extremity without obvious cogwheeling.  Fine motor skills are mildly impaired on the left, fairly normal on the right, foot agility is mildly impaired on the left and slightly impaired on the right.   He does have mild cocontraction with the right hand while activating the left hand. He stands without difficulty, posture is mildly stooped for age, he walks with fairly good stride length and pace but decreased arm swing on the left.   Cerebellar testing: No dysmetria or intention tremor on finger to nose testing. Heel to shin is unremarkable bilaterally. There is no truncal or gait ataxia.  Sensory exam: intact to light touch.    Assessment and Plan:    In summary, Wesley [name]. is a very pleasant 61 year old male with an underlying medical history of hyperlipidemia, reflux disease, left hand pain and obesity, who presents for follow-up consultation of his left sided predominant Parkinson's disease with symptoms dating back to  early 2021. His DaTscan in June 2021 was supportive for an underlying parkinsonian syndrome.  He has been on ropinirole since July 2021.  This was increased to 2 mg 3 times daily in October 2021.  He had an appointment with Dr. Carles Collet at [facility] neurology for second opinion in January 2022.  He was noted to have weight increase and increased snacking, concern for compulsive eating.   He has had weight gain.  We talked about the importance of weight management again today.  He is willing to add some cardio exercises to his exercise regimen, he likes to do strength training at home.  He has intermittent constipation which is generally under control and he has added some fiber to his diet intermittently.  He is encouraged to add vitamin D as he was found to be low with vitamin D but he should check with his primary care physician as well.  I do not have labs available for review in that regard.  He had a CDL renewal appointment with a DOT physician last year in [place] and was advised to get a support letter from his neurologist as I understand.  At this juncture, I explained to them I do not have any major concerns from the neurological and Parkinson's disease standpoint to prevent him to renew his license.  Nevertheless, it is at the discretion of his DOT physician.  He is also advised to seek a formal evaluation of his vision and eye symptoms.  He has not had an eye examination in 4 to 5 years by his recollection.  He has had some intermittent blurry vision and double vision.  Eye examination today for me was unremarkable.  He is advised to increase his levodopa at this point 1 pill 4 times daily because of residual significant tremor in the left hand.  He is advised to continue with the ropinirole 2 mg 3 times daily.  I renewed his levodopa prescription to allow for a fourth dose, he is advised to take it at 7 AM, 11 AM, 3 PM and 7 PM daily.  He is advised to take the Requip at 7 AM and 3 PM as well as 7 PM daily.  He is advised to stay well-hydrated and well rested.  I suggested a follow-up in this clinic in 6 months routinely, sooner if needed.  I will write a letter of support so he can take this to his DOT physician.  I answered all his questions today and he was in agreement with the plan.   I spent 40 minutes in total face-to-face time and in reviewing records during pre-charting,  more than 50% of which was spent in counseling and coordination of care, reviewing test results, reviewing medications and treatment regimen and/or in discussing or reviewing the diagnosis of PD, the prognosis and treatment options. Pertinent laboratory and imaging test results that were available during this visit with the patient were reviewed by me and considered in my medical decision making (see chart for details).

## 2021-03-07 NOTE — Patient Instructions (Signed)
It was nice to see you again today.  As discussed, for your residual tremor, I would recommend you increase your carbidopa-levodopa to 1 pill 4 times a day, namely at 7 AM, 11 AM, 3 PM and 7 PM daily.  I have adjusted your prescription in that regard.  Please continue your ropinirole 1 mg strength 2 pills 3 times daily at 7 AM, 3 PM and 7 PM daily.  Please continue to monitor for constipation issues.  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  We will have to review every time your CDL comes up for renewal.  Please continue to pursue a healthy lifestyle and monitor your weight.  Try to exercise on a regular basis not just with strength training but also cardio. Follow-up in about 6 months, please schedule an eye examination with your eye doctor, as you have had some blurry vision and double vision but have not had an eye examination in 4 to 5 years.

## 2021-05-08 DIAGNOSIS — H6990 Unspecified Eustachian tube disorder, unspecified ear: Secondary | ICD-10-CM | POA: Diagnosis not present

## 2021-05-08 DIAGNOSIS — R6883 Chills (without fever): Secondary | ICD-10-CM | POA: Diagnosis not present

## 2021-05-08 DIAGNOSIS — Z03818 Encounter for observation for suspected exposure to other biological agents ruled out: Secondary | ICD-10-CM | POA: Diagnosis not present

## 2021-06-04 DIAGNOSIS — D485 Neoplasm of uncertain behavior of skin: Secondary | ICD-10-CM | POA: Diagnosis not present

## 2021-06-04 DIAGNOSIS — Z1283 Encounter for screening for malignant neoplasm of skin: Secondary | ICD-10-CM | POA: Diagnosis not present

## 2021-06-04 DIAGNOSIS — D225 Melanocytic nevi of trunk: Secondary | ICD-10-CM | POA: Diagnosis not present

## 2021-06-04 DIAGNOSIS — A630 Anogenital (venereal) warts: Secondary | ICD-10-CM | POA: Diagnosis not present

## 2021-06-21 DIAGNOSIS — D485 Neoplasm of uncertain behavior of skin: Secondary | ICD-10-CM | POA: Diagnosis not present

## 2021-06-21 DIAGNOSIS — L988 Other specified disorders of the skin and subcutaneous tissue: Secondary | ICD-10-CM | POA: Diagnosis not present

## 2021-06-21 DIAGNOSIS — D225 Melanocytic nevi of trunk: Secondary | ICD-10-CM | POA: Diagnosis not present

## 2021-07-10 DIAGNOSIS — L98429 Non-pressure chronic ulcer of back with unspecified severity: Secondary | ICD-10-CM | POA: Diagnosis not present

## 2021-07-10 DIAGNOSIS — D485 Neoplasm of uncertain behavior of skin: Secondary | ICD-10-CM | POA: Diagnosis not present

## 2021-08-02 ENCOUNTER — Other Ambulatory Visit: Payer: Self-pay

## 2021-08-02 DIAGNOSIS — F17210 Nicotine dependence, cigarettes, uncomplicated: Secondary | ICD-10-CM

## 2021-08-02 DIAGNOSIS — Z122 Encounter for screening for malignant neoplasm of respiratory organs: Secondary | ICD-10-CM

## 2021-08-02 DIAGNOSIS — Z87891 Personal history of nicotine dependence: Secondary | ICD-10-CM

## 2021-08-12 ENCOUNTER — Ambulatory Visit
Admission: RE | Admit: 2021-08-12 | Discharge: 2021-08-12 | Disposition: A | Discharge status: Home / Self Care | Payer: BC Managed Care – PPO | Source: Ambulatory Visit | Attending: Acute Care | Admitting: Acute Care

## 2021-08-12 DIAGNOSIS — Z122 Encounter for screening for malignant neoplasm of respiratory organs: Secondary | ICD-10-CM

## 2021-08-12 DIAGNOSIS — Z87891 Personal history of nicotine dependence: Secondary | ICD-10-CM

## 2021-08-12 DIAGNOSIS — F17210 Nicotine dependence, cigarettes, uncomplicated: Secondary | ICD-10-CM | POA: Diagnosis not present

## 2021-08-15 ENCOUNTER — Other Ambulatory Visit: Payer: Self-pay

## 2021-08-15 DIAGNOSIS — Z87891 Personal history of nicotine dependence: Secondary | ICD-10-CM

## 2021-08-15 DIAGNOSIS — F17210 Nicotine dependence, cigarettes, uncomplicated: Secondary | ICD-10-CM

## 2021-08-15 DIAGNOSIS — Z122 Encounter for screening for malignant neoplasm of respiratory organs: Secondary | ICD-10-CM

## 2021-08-22 ENCOUNTER — Other Ambulatory Visit: Payer: BC Managed Care – PPO

## 2021-09-05 ENCOUNTER — Encounter: Payer: Self-pay | Admitting: Neurology

## 2021-09-05 ENCOUNTER — Ambulatory Visit: Payer: BC Managed Care – PPO | Admitting: Neurology

## 2021-09-05 VITALS — BP 119/73 | HR 64 | Ht 64.0 in | Wt 228.0 lb

## 2021-09-05 DIAGNOSIS — K5900 Constipation, unspecified: Secondary | ICD-10-CM | POA: Diagnosis not present

## 2021-09-05 DIAGNOSIS — G20 Parkinson's disease: Secondary | ICD-10-CM

## 2021-09-05 MED ORDER — ROPINIROLE HCL 1 MG PO TABS: 2.0000 mg | ORAL_TABLET | Freq: Three times a day (TID) | ORAL | 3 refills | Status: DC

## 2021-09-05 NOTE — Patient Instructions (Signed)
It was nice to see you again today.  As discussed, we can consider focused ultrasound therapy if you find a place that offers this treatments for Parkinson's patients.  I would be happy to look into it and make a referral as necessary.  We can consider evaluation for deep brain stimulation as well, I can make a referral if you would like to get a formal evaluation done.  Please continue to take your levodopa 4 times a day at 6:30 AM, 10:30 AM, 2:30 PM, and 6:30 PM daily.  Please continue with ropinirole 2 pills at 6:30 AM, 2:30 PM, and 6:30 PM daily.

## 2021-09-05 NOTE — Progress Notes (Signed)
Subjective:    Patient ID: Wesley [name]. is a 61 y.o. male.  HPI    Interim history:   Wesley [name] is a 61 year old right-handed gentleman with an underlying medical history of hyperlipidemia, reflux disease, left hand pain and obesity, who presents for follow-up consultation of his left sided PD.  The patient is unaccompanied today. He missed an appointment on 12/03/20. I last saw him on 03/07/2021, at which time he reported interim weight gain.  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  I wrote a letter of support, however, he was advised to seek evaluation with an eye doctor.  He was advised to increase his Sinemet to 1 pill 4 times a day and continue with ropinirole 2 mg 3 times daily.  Today, 09/05/2021: He reports feeling more or less stable, tries to be on time and on schedule with his medications, generally takes his morning dose for both levodopa and ropinirole around 6:30 AM, second dose around noon, third dose for the ropinirole around 6:30 PM, but the levodopa but he tries to take a dose between 2 and 3 PM and then last dose around 6 PM when he comes home.  He continues to smoke about 15 cigarettes/day, he drinks alcohol in the form of wine, 1 or 2 glasses/day on average, tries to exercise using his bike on the weekends and also weight lifting.  He has not fallen, constipation is occasional, more so slow that hard or painful.  He tries to hydrate well with water, he is interested in learning more about focused ultrasound and may be going to a place that offers this, has not found a place that offers this locally yet.  I have not referred patients for focused ultrasound therapy on a regular basis and would be amenable to referring him if he finds a place of his choosing.  Deep brain stimulation was also discussed today as a possible treatment option, he is not currently ready for a referral for consultation.  The patient's allergies, current medications, family history, past medical history, past social  history, past surgical history and problem list were reviewed and updated as appropriate.    Previously:   I saw him on 08/01/2020, at which time we talked about the importance of healthy lifestyle and constipation management.  He had gained weight secondary to excessive snacking.  We agreed to monitor this.  He was advised to continue with ropinirole and levodopa but encouraged to be very consistent with his medication regimen.       I saw him on 04/16/2020, at which time he was taking ropinirole 2 pills 3 times daily.  He admitted to snacking on cookies and milk.  He had gained weight in the realm of 15 pounds.  He had seen Dr. Carles Collet for second opinion in January 2022.  He was advised to monitor his symptoms and his weight, we decided to continue with ropinirole 2 mg strength 3 times a day.  He was advised to start Sinemet with gradual titration.     I saw him on 12/08/2019, at which time he reported feeling about the same.  He was able to tolerate Requip 1 mg strength 1 pill 3 times daily.  He has not noticed any telltale results from it, did not think his tremor was any better.  I advised him to increase the ropinirole to 2 mg 3 times daily.   He had an interim appointment with Dr. Carles Collet on 03/15/2020 for second opinion of his Parkinson's  disease.  Dr. Carles Collet concurred with the diagnosis and talk to him about his weight gain and compulsive overeating.  Levodopa therapy was also discussed.     I saw him on 09/07/2019, at which time he felt stable.  He was working on smoking cessation. He was also advised to cut back on his daily alcohol consumption. He was advised regarding his DaTscan results and encouraged to start Requip low-dose with gradual titration.  He reached out in the interim in August 2021 and reported that he had not noticed any telltale response.  He felt jittery and restless and was advised to increase the Requip to 1 mg 3 times daily.     I first met him on 08/04/2019, at the request of Marella Chimes, PA in rheumatology, at which time the patient reported an approximately 69-monthhistory of tremors affecting his left hand and stiffness and pain in the left wrist.  His findings were concerning for left-sided parkinsonism.  He was advised to proceed with a DaTscan for better diagnostic clarity.    He had a DaT scan on 08/31/19 and I reviewed the results: IMPRESSION: Loss of activity in the posterior striata and asymmetric decreased activity in the head of the RIGHT caudate nucleus. This pattern can be seen in Parkinsonian syndromes.   Of note, DaTSCAN is not diagnostic of Parkinsonian syndromes, which remains a clinical diagnosis. DaTscan is an adjuvant test to aid in the clinical diagnosis of Parkinsonian syndromes.   We called him with the test result and arranged a FU appointment.      08/04/19: (He) reports an intermittent tremor affecting his left hand.  I reviewed your office note from 06/21/2019.   He has seen his primary care physician.  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 He has had an intermittent tremor for about 6 months and aching sensation and stiffness in the left hand.  He denies any recent falls.  He does not have any tremor in the lower body.  He does not have a family history of tremors or Parkinson's disease.  He had an x-ray of the hand and wrist was tried on Mobic which did not provide any significant relief and also had a steroid shot.  He reports that his primary care physician told him it is not Parkinson's disease.  The patient reports some issue with his balance from time to time, no obvious changes in his walking or posture.  His wife has not noticed any obvious changes that she has mentioned.  He denies any sudden onset of one-sided weakness or numbness or tingling or droopy face or slurring of speech.  He feels that perhaps in the past 6 months he has had some more fatigue.  He works full-time as a lNurse, adult  He smokes cigarettes daily, less than  1 pack/day.  He drinks alcohol daily about 2 glasses of wine per day.  He has never had a brain MRI or CT scan.       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   Hyperlipidemia    Primary parkinsonism ([facility] 08/2019   neurologist-- dr aRexene Alberts- left sided w/ hand tremor    His Past Surgical History Is Significant For: Past Surgical History:  Procedure Laterality Date   EXAMINATION UNDER ANESTHESIA  07/16/2011   Procedure: EXAM UNDER ANESTHESIA;  Surgeon: TJoyice Faster Cornett, MD;  [place] [facility]  Service: General;  Laterality: N/A;   EXAMINATION UNDER ANESTHESIA N/A 09/30/2012   Procedure: EXAM UNDER ANESTHESIA;  Surgeon: Leighton Ruff, MD;  Location[facility];  Service: General;  Laterality: N/A;   HIGH RESOLUTION ANOSCOPY N/A 09/30/2012   Procedure: HIGH RESOLUTION ANOSCOPY;  Surgeon: Leighton Ruff, MD;  Location: [place];  Service: General;  Laterality: N/A;   INGUINAL HERNIA REPAIR  1980'S   LASER ABLATION CONDOLAMATA N/A 09/30/2012   Procedure: LASER ABLATION CONDOLAMATA;  Surgeon: Leighton Ruff, MD;  Location: [place];  Service: General;  Laterality: N/A;   [place] N/A 02/02/2020   Procedure: LASER ABLATION CONDYLOMA;  Surgeon: Leighton Ruff, MD;  Location: [place];  Service: General;  Laterality: N/A;   RECTAL EXAM UNDER ANESTHESIA N/A 02/02/2020   Procedure: RECTAL EXAM UNDER ANESTHESIA;  Surgeon: Leighton Ruff, MD;  Location: [facility];  Service: General;  Laterality: N/A;   WART FULGURATION  07/16/2011   Procedure: FULGURATION ANAL WART;  Surgeon: Joyice Faster. Cornett, MD;  Location: [place];  Service: General;  Laterality: N/A;    His Family History Is Significant For: Family History  Problem Relation Age of Onset   Alzheimer's disease Mother    Heart disease Father     Parkinson's disease Neg Hx     His Social History Is Significant For: Social History   Socioeconomic History   Marital status: Married    Spouse name: Not on file   Number of children: Not on file   Years of education: Not on file   Highest education level: Not on file  Occupational History   Not on file  Tobacco Use   Smoking status: Every Day    Packs/day: 1.00    Years: 35.00    Total pack years: 35.00    Types: Cigarettes   Smokeless tobacco: Never   Tobacco comments:    Counseled on reduce to smoke  Vaping Use   Vaping Use: Never used  Substance and Sexual Activity   Alcohol use: Yes    Alcohol/week: 4.0 standard drinks of alcohol    Types: 4 Glasses of wine per week    Comment: 2 WINE PER DAY   Drug use: Never   Sexual activity: Not on file  Other Topics Concern   Not on file  Social History Narrative   Not on file   Social Determinants of Health   Financial Resource Strain: Not on file  Food Insecurity: Not on file  Transportation Needs: Not on file  Physical Activity: Not on file  Stress: Not on file  Social Connections: Not on file    His Allergies Are:  No Known Allergies:   His Current Medications Are:  Outpatient Encounter Medications as of 09/05/2021  Medication Sig   acetaminophen (TYLENOL) 325 MG tablet Take 650 mg by mouth every 6 (six) hours as needed.   carbidopa-levodopa (SINEMET IR) 25-100 MG tablet Take 1 tablet by mouth 4 (four) times daily. Follow instructions provided separately.   rOPINIRole (REQUIP) 1 MG tablet Take 2 tablets (2 mg total) by mouth 3 (three) times daily.   simvastatin (ZOCOR) 40 MG tablet Take 40 mg by mouth daily. AM   Cholecalciferol (VITAMIN D3) 50 MCG (2000 UT) capsule  (Patient not taking: Reported on 08/01/2020)   No facility-administered encounter medications on file as of 09/05/2021.  :  Review of Systems:  Out of a complete 14 point review of systems, all are reviewed  and negative with the exception of these  symptoms as listed below:  Review of Systems  Neurological:        Pt here for parkinson's  follow    Pt wants to discuss new therapies for parkinson's     Objective:  Neurological Exam  Physical Exam Physical Examination:   Vitals:   09/05/21 0939  BP: 119/73  Pulse: 64    General Examination: The patient is a very pleasant 61 y.o. male in no acute distress. He appears well-developed and well-nourished and well groomed.   HEENT: Normocephalic, atraumatic, pupils are equal, round and reactive to light, tracking is well preserved, no nystagmus is seen. He has mild facial masking, mild to moderate nuchal rigidity, no significant hypophonia or dysarthria.  He has an intermittent lower jaw tremor, no significant voice tremor.  He has mild to mouth dryness, mild airway crowding, no obvious sialorrhea, tongue protrudes centrally and palate elevates symmetrically.  Hearing is grossly intact.   Chest: Clear to auscultation without wheezing, rhonchi or crackles noted.   Heart: S1+S2+0, regular and normal without murmurs, rubs or gallops noted.    Abdomen: Soft, non-tender and non-distended.  Normal bowel sounds.   Extremities: There is no pitting edema in the distal lower extremities bilaterally, perhaps trace edema left ankle.    Skin: Warm and dry without trophic changes noted.   Musculoskeletal: exam reveals no obvious joint deformities, but still reports stiffness in his left hand.    Neurologically:  Mental status: The patient is awake, alert and oriented in all 4 spheres. His immediate and remote memory, attention, language skills and fund of knowledge are appropriate. There is no evidence of aphasia, agnosia, apraxia or anomia. Speech is clear with normal prosody and enunciation. Thought process is linear. Mood is normal and affect is normal.     (On Archimedes spiral drawing he has insecurity with his nondominant hand but no obvious trembling with either hand. Handwriting is  legible, not particularly micrographic.)   He has a fairly consistent left hand resting tremor, which is currently mild.  He has no resting tremor in the right upper extremity or lower extremities. He has a slight postural tremor in the left more than right upper extremity, no significant action tremor.     Cranial nerves II - XII are as described above under HEENT exam. In addition: right shoulder is slightly higher than left. Motor exam: Normal bulk, and strength is noted, tone is mildly increased in the left upper extremity, particularly in the wrist, with cogwheeling noted in the wrist and elbow on the left and slight increase in tone noted in the right upper extremity without obvious cogwheeling.  Fine motor skills are mildly impaired on the left, and near normal on the right, foot agility is mildly impaired on the left and slightly impaired on the right.   He does have mild cocontraction with the right hand while activating the left hand. He stands without difficulty, posture is mildly stooped for age, he walks with fairly good stride length and pace but decreased arm swing on the left.   Cerebellar testing: No dysmetria or intention tremor. There is no truncal or gait ataxia.  Sensory exam: intact to light touch.    Assessment and Plan:    In summary, Wesley [name]. is a very pleasant 61 year old male with an underlying medical history of hyperlipidemia, reflux disease, left hand pain and obesity, who presents for follow-up consultation of his left sided predominant  Parkinson's disease with symptoms dating back to early 2021. His DaTscan in June 2021 was supportive for an underlying parkinsonian syndrome.  He has been on ropinirole since July 2021. This was increased to 2 mg 3 times daily in October 2021.  He had an interim appointment with Dr. Carles Collet at [facility] neurology for second opinion in January 2022.  He was noted to have weight increase and increased snacking, concern for compulsive  eating.  He has had weight gain.  We talked about the importance of maintaining healthy weight and healthy lifestyle in general.  He is discouraged from consuming alcohol on a daily basis more than 2 servings and limit himself to 1 or 2 servings which he is currently doing but also advised that taking medication for Parkinson's disease and drinking alcohol daily can cause at times interactions.  He is advised to continue to work on smoking cessation completely.  He is commended for trying to exercise but is advised to try to exercise regularly throughout the workweek even if possible.  He works full-time.  He has had intermittent constipation which is currently under control.  We increased his levodopa to 1 pill 4 times a day in January 2023.  He is advised to continue with this at 6:30 AM, 10:30 AM, 2:30 PM, and 6:30 PM daily.  He is advised to take the ropinirole at 6:30 AM, 2:30 PM, and 6:30 PM daily.   We talked about DBS today, we talked about the possibility of pursuing focused ultrasound therapy if he finds a place, I can look into it.  I can make a referral as necessary.  He does not currently wish to pursue DBS consultation at this time.  We can revisit in the future.  I suggested a follow-up in this clinic in 6 months routinely, sooner if needed.  I answered all his questions today and he was in agreement with the plan.

## 2021-10-29 DIAGNOSIS — Z1283 Encounter for screening for malignant neoplasm of skin: Secondary | ICD-10-CM | POA: Diagnosis not present

## 2021-10-29 DIAGNOSIS — D485 Neoplasm of uncertain behavior of skin: Secondary | ICD-10-CM | POA: Diagnosis not present

## 2021-10-29 DIAGNOSIS — L820 Inflamed seborrheic keratosis: Secondary | ICD-10-CM | POA: Diagnosis not present

## 2021-10-29 DIAGNOSIS — D225 Melanocytic nevi of trunk: Secondary | ICD-10-CM | POA: Diagnosis not present

## 2021-10-29 DIAGNOSIS — L814 Other melanin hyperpigmentation: Secondary | ICD-10-CM | POA: Diagnosis not present

## 2021-10-29 DIAGNOSIS — L821 Other seborrheic keratosis: Secondary | ICD-10-CM | POA: Diagnosis not present

## 2021-10-29 DIAGNOSIS — L988 Other specified disorders of the skin and subcutaneous tissue: Secondary | ICD-10-CM | POA: Diagnosis not present

## 2021-11-13 DIAGNOSIS — L988 Other specified disorders of the skin and subcutaneous tissue: Secondary | ICD-10-CM | POA: Diagnosis not present

## 2021-11-13 DIAGNOSIS — D0461 Carcinoma in situ of skin of right upper limb, including shoulder: Secondary | ICD-10-CM | POA: Diagnosis not present

## 2022-01-06 DIAGNOSIS — Z13220 Encounter for screening for lipoid disorders: Secondary | ICD-10-CM | POA: Diagnosis not present

## 2022-01-06 DIAGNOSIS — Z0000 Encounter for general adult medical examination without abnormal findings: Secondary | ICD-10-CM | POA: Diagnosis not present

## 2022-01-06 DIAGNOSIS — Z125 Encounter for screening for malignant neoplasm of prostate: Secondary | ICD-10-CM | POA: Diagnosis not present

## 2022-01-08 DIAGNOSIS — Z0000 Encounter for general adult medical examination without abnormal findings: Secondary | ICD-10-CM | POA: Diagnosis not present

## 2022-02-28 DIAGNOSIS — K640 First degree hemorrhoids: Secondary | ICD-10-CM | POA: Diagnosis not present

## 2022-02-28 DIAGNOSIS — K635 Polyp of colon: Secondary | ICD-10-CM | POA: Diagnosis not present

## 2022-02-28 DIAGNOSIS — K5730 Diverticulosis of large intestine without perforation or abscess without bleeding: Secondary | ICD-10-CM | POA: Diagnosis not present

## 2022-02-28 DIAGNOSIS — Z1211 Encounter for screening for malignant neoplasm of colon: Secondary | ICD-10-CM | POA: Diagnosis not present

## 2022-03-10 ENCOUNTER — Ambulatory Visit: Payer: BC Managed Care – PPO | Admitting: Neurology

## 2022-03-16 ENCOUNTER — Other Ambulatory Visit: Payer: Self-pay | Admitting: Neurology

## 2022-03-16 DIAGNOSIS — G20A1 Parkinson's disease without dyskinesia, without mention of fluctuations: Secondary | ICD-10-CM

## 2022-03-26 DIAGNOSIS — B078 Other viral warts: Secondary | ICD-10-CM | POA: Diagnosis not present

## 2022-03-26 DIAGNOSIS — L818 Other specified disorders of pigmentation: Secondary | ICD-10-CM | POA: Diagnosis not present

## 2022-03-26 DIAGNOSIS — Z1283 Encounter for screening for malignant neoplasm of skin: Secondary | ICD-10-CM | POA: Diagnosis not present

## 2022-03-26 DIAGNOSIS — D225 Melanocytic nevi of trunk: Secondary | ICD-10-CM | POA: Diagnosis not present

## 2022-03-26 DIAGNOSIS — L814 Other melanin hyperpigmentation: Secondary | ICD-10-CM | POA: Diagnosis not present

## 2022-04-09 ENCOUNTER — Encounter: Payer: Self-pay | Admitting: Neurology

## 2022-04-09 ENCOUNTER — Ambulatory Visit: Payer: BC Managed Care – PPO | Admitting: Neurology

## 2022-04-09 VITALS — BP 132/61 | HR 60 | Ht 68.0 in | Wt 231.6 lb

## 2022-04-09 DIAGNOSIS — G20A2 Parkinson's disease without dyskinesia, with fluctuations: Secondary | ICD-10-CM | POA: Diagnosis not present

## 2022-04-09 MED ORDER — ROPINIROLE HCL 2 MG PO TABS: 2.0000 mg | ORAL_TABLET | Freq: Three times a day (TID) | ORAL | 3 refills | Status: DC

## 2022-04-09 NOTE — Patient Instructions (Signed)
It was nice to see you again today.  You look well.  Please continue to take your medications, I am switching the ropinirole from 1 mg strength 2 pills 3 times daily to 2 mg strength 1 pill 3 times daily for convenience.  Please follow-up in 6 months to see one of our nurse practitioners, continue to exercise regularly, it may be helpful to consult with a personal trainer for safe use of weights and weight training.

## 2022-04-09 NOTE — Progress Notes (Signed)
Subjective:    Patient ID: Ambrose Wile. is a 62 y.o. male.  HPI    Interim history:   Mr. Pyon is a 62 year old right-handed gentleman with an underlying medical history of hyperlipidemia, reflux disease, left hand pain and obesity, who presents for follow-up consultation of his left sided PD.  The patient is unaccompanied today. He missed an appointment on 03/10/2022. I last saw him on 09/05/2021, at which time he felt fairly stable.  He was trying to be on time with his medication scheduled for propranolol and levodopa.  We talked about the importance of smoking cessation.  He was encouraged to limit his alcohol intake avoid drinking daily wine.  We also discussed other treatment options such as DBS and focused ultrasound treatment.  He was advised to continue with levodopa 4 times a day starting at 6:30 AM on a 4 hourly basis.  He was advised to continue with ropinirole 2 mg 3 times daily.  Today, 04/09/2022: He reports overall doing well, tries to take his medicine on time and tries to stay active mentally and physically, works some from home and some in the office, he does have a stand-up desk at work but not at home, he tries to exercise at home with weights and likes to work in the yard.  He limits his caffeine to usually 2 cups of coffee per day, 1 in the morning and 1 around 4 PM.  He hydrates well with water, does continue to drink alcohol daily about 1-2 drinks per day, continues to smoke but would like to eventually quit he may have pulled a muscle in his lower back while doing weights, using dumbbells for squatting.  He does not currently have a gym membership but would be interested in looking into it.  On 2 occasions he has noticed significant cold fingers, blanching of the fingers from the knuckles down with some discoloration, almost to the point of pain fullness, nothing in the feet.   The patient's allergies, current medications, family history, past medical history, past social  history, past surgical history and problem list were reviewed and updated as appropriate.    Previously:    He missed an appointment on 12/03/20.  I saw him on 03/07/2021, at which time he reported interim weight gain.  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  I wrote a letter of support, however, he was advised to seek evaluation with an eye doctor.  He was advised to increase his Sinemet to 1 pill 4 times a day and continue with ropinirole 2 mg 3 times daily.    I saw him on 08/01/2020, at which time we talked about the importance of healthy lifestyle and constipation management.  He had gained weight secondary to excessive snacking.  We agreed to monitor this.  He was advised to continue with ropinirole and levodopa but encouraged to be very consistent with his medication regimen.       I saw him on 04/16/2020, at which time he was taking ropinirole 2 pills 3 times daily.  He admitted to snacking on cookies and milk.  He had gained weight in the realm of 15 pounds.  He had seen Dr. Arbutus Leas for second opinion in January 2022.  He was advised to monitor his symptoms and his weight, we decided to continue with ropinirole 2 mg strength 3 times a day.  He was advised to start Sinemet with gradual titration.     I saw him on 12/08/2019, at which  time he reported feeling about the same.  He was able to tolerate Requip 1 mg strength 1 pill 3 times daily.  He has not noticed any telltale results from it, did not think his tremor was any better.  I advised him to increase the ropinirole to 2 mg 3 times daily.   He had an interim appointment with Dr. Arbutus Leas on 03/15/2020 for second opinion of his Parkinson's disease.  Dr. Arbutus Leas concurred with the diagnosis and talk to him about his weight gain and compulsive overeating.  Levodopa therapy was also discussed.     I saw him on 09/07/2019, at which time he felt stable.  He was working on smoking cessation. He was also advised to cut back on his daily alcohol consumption. He was advised  regarding his DaTscan results and encouraged to start Requip low-dose with gradual titration.  He reached out in the interim in August 2021 and reported that he had not noticed any telltale response.  He felt jittery and restless and was advised to increase the Requip to 1 mg 3 times daily.     I first met him on 08/04/2019, at the request of Elpidio Anis, PA in rheumatology, at which time the patient reported an approximately 32-month history of tremors affecting his left hand and stiffness and pain in the left wrist.  His findings were concerning for left-sided parkinsonism.  He was advised to proceed with a DaTscan for better diagnostic clarity.    He had a DaT scan on 08/31/19 and I reviewed the results: IMPRESSION: Loss of activity in the posterior striata and asymmetric decreased activity in the head of the RIGHT caudate nucleus. This pattern can be seen in Parkinsonian syndromes.   Of note, DaTSCAN is not diagnostic of Parkinsonian syndromes, which remains a clinical diagnosis. DaTscan is an adjuvant test to aid in the clinical diagnosis of Parkinsonian syndromes.   We called him with the test result and arranged a FU appointment.      08/04/19: (He) reports an intermittent tremor affecting his left hand.  I reviewed your office note from 06/21/2019.   He has seen his primary care physician.  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 He has had an intermittent tremor for about 6 months and aching sensation and stiffness in the left hand.  He denies any recent falls.  He does not have any tremor in the lower body.  He does not have a family history of tremors or Parkinson's disease.  He had an x-ray of the hand and wrist was tried on Mobic which did not provide any significant relief and also had a steroid shot.  He reports that his primary care physician told him it is not Parkinson's disease.  The patient reports some issue with his balance from time to time, no obvious changes in his walking  or posture.  His wife has not noticed any obvious changes that she has mentioned.  He denies any sudden onset of one-sided weakness or numbness or tingling or droopy face or slurring of speech.  He feels that perhaps in the past 6 months he has had some more fatigue.  He works full-time as a Medical sales representative.  He smokes cigarettes daily, less than 1 pack/day.  He drinks alcohol daily about 2 glasses of wine per day.  He has never had a brain MRI or CT scan.        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 Cornett, MD;  Location: [place];  Service: General;  Laterality: N/A;   EXAMINATION UNDER ANESTHESIA N/A 09/30/2012   Procedure: EXAM UNDER ANESTHESIA;  Surgeon: Leighton Ruff, MD;  Location: [place];  Service: General;  Laterality: N/A;   HIGH RESOLUTION ANOSCOPY N/A 09/30/2012   Procedure: HIGH RESOLUTION ANOSCOPY;  Surgeon: Leighton Ruff, MD;  Location: [place];  Service: General;  Laterality: N/A;   INGUINAL HERNIA REPAIR  1980'S   LASER ABLATION CONDOLAMATA N/A 09/30/2012   Procedure: LASER ABLATION CONDOLAMATA;  Surgeon: Leighton Ruff, MD;  Location: [place];  Service: General;  Laterality: N/A;   [place] N/A 02/02/2020   Procedure: LASER ABLATION CONDYLOMA;  Surgeon: Leighton Ruff, MD;  Location: [place];  Service: General;  Laterality: N/A;   RECTAL EXAM UNDER ANESTHESIA N/A 02/02/2020   Procedure: RECTAL EXAM UNDER ANESTHESIA;  Surgeon: Leighton Ruff, MD;  [facility];  Service: General;   Laterality: N/A;   WART FULGURATION  07/16/2011   Procedure: FULGURATION ANAL WART;  Surgeon: Joyice Faster. Cornett, MD;  Location: [place];  Service: General;  Laterality: N/A;    His Family History Is Significant For: Family History  Problem Relation Age of Onset   Alzheimer's disease Mother    Heart disease Father    Parkinson's disease Neg Hx     His Social History Is Significant For: Social History   Socioeconomic History   Marital status: Married    Spouse name: Not on file   Number of children: Not on file   Years of education: Not on file   Highest education level: Not on file  Occupational History   Not on file  Tobacco Use   Smoking status: Every Day    Packs/day: 0.50    Years: 35.00    Total pack years: 17.50    Types: Cigarettes   Smokeless tobacco: Never   Tobacco comments:    Counseled on reduce to smoke  Vaping Use   Vaping Use: Never used  Substance and Sexual Activity   Alcohol use: Yes    Alcohol/week: 2.0 standard drinks of alcohol    Types: 2 Glasses of wine per week    Comment: 2 WINE PER DAY   Drug use: Never   Sexual activity: Not on file  Other Topics Concern   Not on file  Social History Narrative   Not on file   Social Determinants of Health   Financial Resource Strain: Not on file  Food Insecurity: Not on file  Transportation Needs: Not on file  Physical Activity: Not on file  Stress: Not on file  Social Connections: Not on file    His Allergies Are:  No Known Allergies:   His Current Medications Are:  Outpatient Encounter Medications as of 04/09/2022  Medication Sig   acetaminophen (TYLENOL) 325 MG tablet Take 650 mg by mouth every 6 (six) hours as needed.   carbidopa-levodopa (SINEMET IR) 25-100 MG tablet TAKE 1 TABLET BY MOUTH 4 (FOUR) TIMES DAILY. FOLLOW INSTRUCTIONS PROVIDED SEPARATELY.   rOPINIRole (REQUIP) 1 MG tablet Take  2 tablets (2 mg total) by mouth 3 (three) times daily.   simvastatin (ZOCOR) 40 MG  tablet Take 40 mg by mouth daily. AM   Cholecalciferol (VITAMIN D3) 50 MCG (2000 UT) capsule  (Patient not taking: Reported on 08/01/2020)   No facility-administered encounter medications on file as of 04/09/2022.  :  Review of Systems:  Out of a complete 14 point review of systems, all are reviewed and negative with the exception of these symptoms as listed below:    Review of Systems  Neurological:        Pt here for Parkinson f/u  Pt states tremors in right hand Pt states both hands are cold to the touch and tingling and fingertips blue     Objective:  Neurological Exam  Physical Exam Physical Examination:   Vitals:   04/09/22 1013  BP: 132/61  Pulse: 60    General Examination: The patient is a very pleasant 62 y.o. male in no acute distress. He appears well-developed and well-nourished and well groomed.   HEENT: Normocephalic, atraumatic, pupils are equal, round and reactive to light, tracking is well preserved, no nystagmus is seen. He has mild facial masking, mild to moderate nuchal rigidity, no significant hypophonia or dysarthria.  He has an intermittent lower jaw tremor, no significant voice tremor.  He has mild to mouth dryness, mild airway crowding, no obvious sialorrhea, tongue protrudes centrally and palate elevates symmetrically.  Hearing is grossly intact.   Chest: Clear to auscultation without wheezing, rhonchi or crackles noted.   Heart: S1+S2+0, regular and normal without murmurs, rubs or gallops noted.    Abdomen: Soft, non-tender and non-distended.     Extremities: There is no pitting edema in the distal lower extremities bilaterally, fingers are colder to touch, no blanching, no discoloration, no pain currently.    Skin: Warm and dry without trophic changes noted.   Musculoskeletal: exam reveals no obvious joint deformities, stable stiffness in his left hand.    Neurologically:  Mental status: The patient is awake, alert and oriented in all 4 spheres. His  immediate and remote memory, attention, language skills and fund of knowledge are appropriate. There is no evidence of aphasia, agnosia, apraxia or anomia. Speech is clear with normal prosody and enunciation. Thought process is linear. Mood is normal and affect is normal.     (On 08/04/19: On Archimedes spiral drawing he has insecurity with his nondominant hand but no obvious trembling with either hand. Handwriting is legible, not particularly micrographic.)   He has a fairly consistent left hand resting tremor, which is currently mild to moderate.  He has a very slight intermittent resting tremor in the right upper extremity today. No significant action tremor.     Cranial nerves II - XII are as described above under HEENT exam. In addition: right shoulder is slightly higher than left. Motor exam: Normal bulk, and strength is noted, tone is mildly increased in the left upper extremity, particularly in the wrist, with cogwheeling noted in the wrist and elbow on the left and slight increase in tone noted in the right upper extremity without obvious cogwheeling.  Fine motor skills are mildly impaired on the left, and better on the right.    He does have mild cocontraction with the right hand while activating the left hand. He stands without difficulty, posture is mildly stooped for age, he walks with fairly good stride length and pace but decreased arm swing on the left.   Cerebellar testing: No dysmetria  or intention tremor. There is no truncal or gait ataxia.  Sensory exam: intact to light touch.    Assessment and Plan:    In summary, Akim Watkinson. is a very pleasant 62 year old male with an underlying medical history of hyperlipidemia, reflux disease, left hand pain and obesity, who presents for follow-up consultation of his left sided predominant Parkinson's disease with symptoms dating back to early 2021. His DaTscan in June 2021 was supportive for an underlying parkinsonian syndrome.  He has  been on ropinirole since July 2021. This was increased to 2 mg 3 times daily in October 2021.  He had a second opinion with Dr. Carles Collet at [facility] neurolog[facility] for in January 2022.  He was noted to have weight increase and increased snacking, concern for compulsive eating.  He has had weight gain.  We talked about the importance of maintaining healthy weight and healthy lifestyle in general.  He is discouraged from consuming alcohol on a daily basis, we talked about it again today.  He is encouraged to continue to work on smoking cessation.  He is advised to stay well-hydrated and well rested and continue with regular exercise, he is encouraged to join a gym and maybe even consulting with a personal trainer to talk about safe use of weights and weight training.  He works full-time.  He has had intermittent constipation which is currently under control. We increased his levodopa to 1 pill 4 times a day in January 2023.  He is advised to continue with his medication regimen, I switched his ropinirole prescription from 1 mg strength 2 pills 3 times a day to 2 mg strength 1 pill 3 times a day.  He is agreeable to the dose adjustment. I suggested a follow-up in this clinic in 6 months routinely, sooner if needed.  He can see one of our nurse practitioners next time.  I answered all his questions today and he was in agreement with the plan.   I spent 30 minutes in total face-to-face time and in reviewing records during pre-charting, more than 50% of which was spent in counseling and coordination of care, reviewing test results, reviewing medications and treatment regimen and/or in discussing or reviewing the diagnosis of PD, the prognosis and treatment options. Pertinent laboratory and imaging test results that were available during this visit with the patient were reviewed by me and considered in my medical decision making (see chart for details).

## 2022-04-24 DIAGNOSIS — Z808 Family history of malignant neoplasm of other organs or systems: Secondary | ICD-10-CM | POA: Diagnosis not present

## 2022-04-24 DIAGNOSIS — L570 Actinic keratosis: Secondary | ICD-10-CM | POA: Diagnosis not present

## 2022-04-24 DIAGNOSIS — D225 Melanocytic nevi of trunk: Secondary | ICD-10-CM | POA: Diagnosis not present

## 2022-04-24 DIAGNOSIS — Z85828 Personal history of other malignant neoplasm of skin: Secondary | ICD-10-CM | POA: Diagnosis not present

## 2022-04-24 DIAGNOSIS — D2372 Other benign neoplasm of skin of left lower limb, including hip: Secondary | ICD-10-CM | POA: Diagnosis not present

## 2022-04-29 DIAGNOSIS — R059 Cough, unspecified: Secondary | ICD-10-CM | POA: Diagnosis not present

## 2022-04-29 DIAGNOSIS — Z03818 Encounter for observation for suspected exposure to other biological agents ruled out: Secondary | ICD-10-CM | POA: Diagnosis not present

## 2022-05-27 DIAGNOSIS — A630 Anogenital (venereal) warts: Secondary | ICD-10-CM | POA: Diagnosis not present

## 2022-07-19 DIAGNOSIS — R519 Headache, unspecified: Secondary | ICD-10-CM | POA: Diagnosis not present

## 2022-07-19 DIAGNOSIS — R0602 Shortness of breath: Secondary | ICD-10-CM | POA: Diagnosis not present

## 2022-07-19 DIAGNOSIS — R5383 Other fatigue: Secondary | ICD-10-CM | POA: Diagnosis not present

## 2022-07-19 DIAGNOSIS — Z20822 Contact with and (suspected) exposure to covid-19: Secondary | ICD-10-CM | POA: Diagnosis not present

## 2022-07-20 DIAGNOSIS — R0602 Shortness of breath: Secondary | ICD-10-CM | POA: Diagnosis not present

## 2022-07-20 DIAGNOSIS — R5383 Other fatigue: Secondary | ICD-10-CM | POA: Diagnosis not present

## 2022-07-21 DIAGNOSIS — R61 Generalized hyperhidrosis: Secondary | ICD-10-CM | POA: Diagnosis not present

## 2022-07-21 DIAGNOSIS — I959 Hypotension, unspecified: Secondary | ICD-10-CM | POA: Diagnosis not present

## 2022-07-21 DIAGNOSIS — Z6835 Body mass index (BMI) 35.0-35.9, adult: Secondary | ICD-10-CM | POA: Diagnosis not present

## 2022-07-21 DIAGNOSIS — R0602 Shortness of breath: Secondary | ICD-10-CM | POA: Diagnosis not present

## 2022-07-28 ENCOUNTER — Other Ambulatory Visit: Payer: Self-pay | Admitting: Acute Care

## 2022-07-28 DIAGNOSIS — Z87891 Personal history of nicotine dependence: Secondary | ICD-10-CM

## 2022-07-28 DIAGNOSIS — Z122 Encounter for screening for malignant neoplasm of respiratory organs: Secondary | ICD-10-CM

## 2022-07-28 DIAGNOSIS — F17210 Nicotine dependence, cigarettes, uncomplicated: Secondary | ICD-10-CM

## 2022-08-20 ENCOUNTER — Ambulatory Visit
Admission: RE | Admit: 2022-08-20 | Discharge: 2022-08-20 | Disposition: A | Discharge status: Home / Self Care | Payer: BC Managed Care – PPO | Source: Ambulatory Visit | Attending: Acute Care | Admitting: Acute Care

## 2022-08-20 DIAGNOSIS — F17210 Nicotine dependence, cigarettes, uncomplicated: Secondary | ICD-10-CM

## 2022-08-20 DIAGNOSIS — Z87891 Personal history of nicotine dependence: Secondary | ICD-10-CM

## 2022-08-20 DIAGNOSIS — Z122 Encounter for screening for malignant neoplasm of respiratory organs: Secondary | ICD-10-CM

## 2022-08-26 ENCOUNTER — Other Ambulatory Visit: Payer: Self-pay

## 2022-08-26 DIAGNOSIS — Z122 Encounter for screening for malignant neoplasm of respiratory organs: Secondary | ICD-10-CM

## 2022-08-26 DIAGNOSIS — F17210 Nicotine dependence, cigarettes, uncomplicated: Secondary | ICD-10-CM

## 2022-08-26 DIAGNOSIS — Z87891 Personal history of nicotine dependence: Secondary | ICD-10-CM

## 2022-09-02 DIAGNOSIS — Z1283 Encounter for screening for malignant neoplasm of skin: Secondary | ICD-10-CM | POA: Diagnosis not present

## 2022-09-02 DIAGNOSIS — D225 Melanocytic nevi of trunk: Secondary | ICD-10-CM | POA: Diagnosis not present

## 2022-10-07 NOTE — Progress Notes (Deleted)
No chief complaint on file.   HISTORY OF PRESENT ILLNESS:  10/07/22 ALL:  Mikael Monette. is a 62 y.o. male here today for follow up for  PD. He was last seen by Dr Frances Furbish 04/2022. Ropinirole was increased to 2mg  TID and carbidopa-levodopa QID continued.    HISTORY (copied from Dr Teofilo Pod previous note)  Mr. Luetkemeyer is a 62 year old right-handed gentleman with an underlying medical history of hyperlipidemia, reflux disease, left hand pain and obesity, who presents for follow-up consultation of his left sided PD.  The patient is unaccompanied today. He missed an appointment on 03/10/2022. I last saw him on 09/05/2021, at which time he felt fairly stable.  He was trying to be on time with his medication scheduled for propranolol and levodopa.  We talked about the importance of smoking cessation.  He was encouraged to limit his alcohol intake avoid drinking daily wine.  We also discussed other treatment options such as DBS and focused ultrasound treatment.  He was advised to continue with levodopa 4 times a day starting at 6:30 AM on a 4 hourly basis.  He was advised to continue with ropinirole 2 mg 3 times daily.   Today, 04/09/2022: He reports overall doing well, tries to take his medicine on time and tries to stay active mentally and physically, works some from home and some in the office, he does have a stand-up desk at work but not at home, he tries to exercise at home with weights and likes to work in the yard.  He limits his caffeine to usually 2 cups of coffee per day, 1 in the morning and 1 around 4 PM.  He hydrates well with water, does continue to drink alcohol daily about 1-2 drinks per day, continues to smoke but would like to eventually quit he may have pulled a muscle in his lower back while doing weights, using dumbbells for squatting.  He does not currently have a gym membership but would be interested in looking into it.  On 2 occasions he has noticed significant cold fingers, blanching of  the fingers from the knuckles down with some discoloration, almost to the point of pain fullness, nothing in the feet.  REVIEW OF SYSTEMS: Out of a complete 14 system review of symptoms, the patient complains only of the following symptoms, and all other reviewed systems are negative.   ALLERGIES: No Known Allergies   HOME MEDICATIONS: Outpatient Medications Prior to Visit  Medication Sig Dispense Refill   acetaminophen (TYLENOL) 325 MG tablet Take 650 mg by mouth every 6 (six) hours as needed.     carbidopa-levodopa (SINEMET IR) 25-100 MG tablet TAKE 1 TABLET BY MOUTH 4 (FOUR) TIMES DAILY. FOLLOW INSTRUCTIONS PROVIDED SEPARATELY. 360 tablet 1   Cholecalciferol (VITAMIN D3) 50 MCG (2000 UT) capsule  (Patient not taking: Reported on 08/01/2020)     rOPINIRole (REQUIP) 2 MG tablet Take 1 tablet (2 mg total) by mouth 3 (three) times daily. Please note, change in pill size/dose. 270 tablet 3   simvastatin (ZOCOR) 40 MG tablet Take 40 mg by mouth daily. AM     No facility-administered medications prior to visit.     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 Cornett, MD;  Location: [facility];  Service: General;  Laterality: N/A;   EXAMINATION UNDER ANESTHESIA N/A 09/30/2012   Procedure: EXAM UNDER ANESTHESIA;  Surgeon: Romie Levee, MD;  Location: [facility];  Service: General;  Laterality: N/A;   HIGH RESOLUTION ANOSCOPY N/A 09/30/2012   Procedure: HIGH RESOLUTION ANOSCOPY;  Surgeon: Romie Levee, MD;  Location: [facility] [place];  Service: General;  Laterality: N/A;   INGUINAL HERNIA REPAIR  1980'S   LASER ABLATION CONDOLAMATA  N/A 09/30/2012   Procedure: LASER ABLATION CONDOLAMATA;  Surgeon: Romie Levee, MD;  Location: [facility];  Service: General;  Laterality: N/A;   LASER ABLATION CONDOLAMATA N/A 02/02/2020   Procedure: LASER ABLATION CONDYLOMA;  Surgeon: Romie Levee, MD;  Location: [facility] [place];  Service: General;  Laterality: N/A;   RECTAL EXAM UNDER ANESTHESIA N/A 02/02/2020   Procedure: RECTAL EXAM UNDER ANESTHESIA;  Surgeon: Romie Levee, MD;  Location: [facility];  Service: General;  Laterality: N/A;   WART FULGURATION  07/16/2011   Procedure: FULGURATION ANAL WART;  Surgeon: Clovis Pu. Cornett, MD;  Location: [facility];  Service: General;  Laterality: N/A;     FAMILY HISTORY: Family History  Problem Relation Age of Onset   Alzheimer's disease Mother    Heart disease Father    Parkinson's disease Neg Hx      SOCIAL HISTORY: Social History   Socioeconomic History   Marital status: Married    Spouse name: Not on file   Number of children: Not on file   Years of education: Not on file   Highest education level: Not on file  Occupational History   Not on file  Tobacco Use   Smoking status: Every Day    Current packs/day: 0.50    Average packs/day: 0.5 packs/day for 35.0 years (17.5 ttl pk-yrs)    Types: Cigarettes   Smokeless tobacco: Never   Tobacco comments:    Counseled on reduce to smoke  Vaping Use   Vaping status: Never Used  Substance and Sexual Activity   Alcohol use: Yes    Alcohol/week: 2.0 standard drinks of alcohol    Types: 2 Glasses of wine per week    Comment: 2 WINE PER DAY   Drug use: Never   Sexual activity: Not on file  Other Topics Concern   Not on file  Social History Narrative   Not on file   Social Determinants of Health   Financial Resource Strain: Not on file  Food Insecurity: Not on file  Transportation Needs: Not on file  Physical Activity: Not on file  Stress: Not on file  Social  Connections: Not on file  Intimate Partner Violence: Not on file     PHYSICAL EXAM  There were no vitals filed for this visit. There is no height or weight on file to calculate BMI.  Generalized: Well developed, in no acute distress  Cardiology: normal rate and rhythm, no murmur auscultated  Respiratory: clear to auscultation bilaterally    Neurological examination  Mentation: Alert oriented to time, place, history taking. Follows all commands speech and language fluent Cranial nerve II-XII: Pupils were equal round reactive to light. Extraocular movements were full, visual field were full on confrontational test. Facial sensation and strength were normal. Uvula tongue midline. Head turning and shoulder shrug  were normal and symmetric. Motor: The motor testing reveals 5 over 5  strength of all 4 extremities. Good symmetric motor tone is noted throughout.  Sensory: Sensory testing is intact to soft touch on all 4 extremities. No evidence of extinction is noted.  Coordination: Cerebellar testing reveals good finger-nose-finger and heel-to-shin bilaterally.  Gait and station: Gait is normal. Tandem gait is normal. Romberg is negative. No drift is seen.  Reflexes: Deep tendon reflexes are symmetric and normal bilaterally.    DIAGNOSTIC DATA (LABS, IMAGING, TESTING) - I reviewed patient records, labs, notes, testing and imaging myself where available.  Lab Results  Component Value Date   WBC 16.8 (H) 11/27/2015   HGB 15.3 11/27/2015   HCT 45.0 11/27/2015   MCV 92.1 11/27/2015   PLT 321 11/27/2015      Component Value Date/Time   NA 139 11/27/2015 2049   K 3.6 11/27/2015 2049   CL 108 11/27/2015 2049   CO2 24 07/15/2011 0950   GLUCOSE 106 (H) 11/27/2015 2049   BUN 15 11/27/2015 2049   CREATININE 1.00 11/27/2015 2049   CALCIUM 9.3 07/15/2011 0950   PROT 7.3 07/15/2011 0950   ALBUMIN 4.0 07/15/2011 0950   AST 30 07/15/2011 0950   ALT 36 07/15/2011 0950   ALKPHOS 97 07/15/2011  0950   BILITOT 0.8 07/15/2011 0950   GFRNONAA >90 07/15/2011 0950   GFRAA >90 07/15/2011 0950   No results found for: "CHOL", "HDL", "LDLCALC", "LDLDIRECT", "TRIG", "CHOLHDL" No results found for: "HGBA1C" No results found for: "VITAMINB12" Lab Results  Component Value Date   TSH 2.433 ***Test methodology is 3rd generation TSH*** 09/05/2006        No data to display               No data to display           ASSESSMENT AND PLAN  62 y.o. year old male  has a past medical history of Anal condyloma (07/2011), Hyperlipidemia, and Primary parkinsonism (08/2019). here with    No diagnosis found.  Clyde Canterbury. ***.  Healthy lifestyle habits encouraged. *** will follow up with PCP as directed. *** will return to see me in ***, sooner if needed. *** verbalizes understanding and agreement with this plan.   No orders of the defined types were placed in this encounter.    No orders of the defined types were placed in this encounter.    Shawnie Dapper, MSN, FNP-C 10/07/2022, 12:48 PM  [facility] [address], [address] [place], [place] [address] [phone]

## 2022-10-09 ENCOUNTER — Ambulatory Visit: Payer: BC Managed Care – PPO | Admitting: Family Medicine

## 2022-10-09 DIAGNOSIS — G20A2 Parkinson's disease without dyskinesia, with fluctuations: Secondary | ICD-10-CM

## 2022-10-13 NOTE — Patient Instructions (Incomplete)
Below is our plan:  We will continue ropinirole 2mg  three times daily and carbidopa-levodopa 1 tablet four times daily.  Please make sure you are staying well hydrated. I recommend 50-60 ounces daily. Well balanced diet and regular exercise encouraged. Consistent sleep schedule with 6-8 hours recommended.   Please continue follow up with care team as directed.   Follow up with me in 6 months   You may receive a survey regarding today's visit. I encourage you to leave honest feed back as I do use this information to improve patient care. Thank you for seeing me today!

## 2022-10-13 NOTE — Progress Notes (Unsigned)
PATIENT: Wesley [name]. DOB: 03-02-1961  REASON FOR VISIT: follow up HISTORY FROM: patient  Virtual Visit via Telephone Note  I connected with Wesley [name]. on 10/14/22 at  8:30 AM EDT by telephone and verified that I am speaking with the correct person using two identifiers.   I discussed the limitations, risks, security and privacy concerns of performing an evaluation and management service by telephone and the availability of in person appointments. I also discussed with the patient that there may be a patient responsible charge related to this service. The patient expressed understanding and agreed to proceed.   History of Present Illness:  10/14/22 ALL (Mychart): Christopher Mcardle. is a 62 y.o. male here today for follow up for  PD. He was last seen by Dr Frances Furbish 04/2022. Ropinirole was increased to 2mg  TID and carbidopa-levodopa QID continued. Since, he reports doing fairly well. Left hand tremor does seem better on increased dose of ropinirole. He is tolerating meds without adverse effects. He has noticed a mild resting tremor of right hand over the past few months. No significant concerns with gait. No trouble with balance. He remains physically active. He has noted intermittent double vision for the past year. He has not had an updated eye exam. Seems to happen more when he is tired or watching sports. Usually gets better if he closes right eye. He reports having more difficulty seeing at night. No obvious weakness. No difficulty swallowing. He denies any cognitive concerns. He continues to work full time in [employer]. He has a harder time typing with left hand but, otherwise, not limited. He is planning an early retirement 11/01/2022 d/t company downsizing.    HISTORY (copied from Dr Teofilo Pod previous note)  Mr. Rossen is a 62 year old right-handed gentleman with an underlying medical history of hyperlipidemia, reflux disease, left hand pain and obesity, who presents for  follow-up consultation of his left sided PD.  The patient is unaccompanied today. He missed an appointment on 03/10/2022. I last saw him on 09/05/2021, at which time he felt fairly stable.  He was trying to be on time with his medication scheduled for propranolol and levodopa.  We talked about the importance of smoking cessation.  He was encouraged to limit his alcohol intake avoid drinking daily wine.  We also discussed other treatment options such as DBS and focused ultrasound treatment.  He was advised to continue with levodopa 4 times a day starting at 6:30 AM on a 4 hourly basis.  He was advised to continue with ropinirole 2 mg 3 times daily.   Today, 04/09/2022: He reports overall doing well, tries to take his medicine on time and tries to stay active mentally and physically, works some from home and some in the office, he does have a stand-up desk at work but not at home, he tries to exercise at home with weights and likes to work in the yard.  He limits his caffeine to usually 2 cups of coffee per day, 1 in the morning and 1 around 4 PM.  He hydrates well with water, does continue to drink alcohol daily about 1-2 drinks per day, continues to smoke but would like to eventually quit he may have pulled a muscle in his lower back while doing weights, using dumbbells for squatting.  He does not currently have a gym membership but would be interested in looking into it.  On 2 occasions he has noticed significant cold fingers, blanching of the  fingers from the knuckles down with some discoloration, almost to the point of pain fullness, nothing in the feet.   Observations/Objective:  Generalized: Well developed, in no acute distress  Mentation: Alert oriented to time, place, history taking. Follows all commands speech and language fluent Motor: no obvious weakness, moves all extremities freely. No tremor notes, today. Ocular movements appear intact. No obvious ptosis.    Assessment and Plan:  62 y.o. 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 here with    ICD-10-CM   1. Parkinson's disease without dyskinesia, with fluctuating manifestations  G20.A2      Mr Habeeb appears to be doing well from a Parkinson's standpoint. We will continue ropinirole 2mg  TID and carb-levo 1 tablet QID. Due to nature of virtual visit, I am unable to complete full neurologic exam. He was encouraged to update eye exam asap. I will have him follow up with Dr Frances Furbish in the office in 4 months to review concerns and consider additional workup as needed. He will continue healthy lifestyle habits. He will call sooner if needed. He verbalizes agreement with this plan.   No orders of the defined types were placed in this encounter.   No orders of the defined types were placed in this encounter.    Follow Up Instructions:  I discussed the assessment and treatment plan with the patient. The patient was provided an opportunity to ask questions and all were answered. The patient agreed with the plan and demonstrated an understanding of the instructions.   The patient was advised to call back or seek an in-person evaluation if the symptoms worsen or if the condition fails to improve as anticipated.  I provided 30 minutes of non-face-to-face time during this encounter. Patient located at their place of residence during Mychart visit. Provider is in the office.    Shawnie Dapper, NP

## 2022-10-14 ENCOUNTER — Telehealth (INDEPENDENT_AMBULATORY_CARE_PROVIDER_SITE_OTHER): Payer: BC Managed Care – PPO | Admitting: Family Medicine

## 2022-10-14 ENCOUNTER — Encounter: Payer: Self-pay | Admitting: Family Medicine

## 2022-10-14 DIAGNOSIS — G20A2 Parkinson's disease without dyskinesia, with fluctuations: Secondary | ICD-10-CM | POA: Diagnosis not present

## 2022-10-14 DIAGNOSIS — H532 Diplopia: Secondary | ICD-10-CM

## 2022-10-14 DIAGNOSIS — G20A1 Parkinson's disease without dyskinesia, without mention of fluctuations: Secondary | ICD-10-CM

## 2022-10-14 MED ORDER — CARBIDOPA-LEVODOPA 25-100 MG PO TABS: 1.0000 | ORAL_TABLET | Freq: Four times a day (QID) | ORAL | 1 refills | Status: DC

## 2022-11-05 ENCOUNTER — Encounter: Payer: Self-pay | Admitting: Neurology

## 2023-01-19 DIAGNOSIS — Z0000 Encounter for general adult medical examination without abnormal findings: Secondary | ICD-10-CM | POA: Diagnosis not present

## 2023-01-19 DIAGNOSIS — Z1159 Encounter for screening for other viral diseases: Secondary | ICD-10-CM | POA: Diagnosis not present

## 2023-01-19 DIAGNOSIS — R1030 Lower abdominal pain, unspecified: Secondary | ICD-10-CM | POA: Diagnosis not present

## 2023-01-19 DIAGNOSIS — E559 Vitamin D deficiency, unspecified: Secondary | ICD-10-CM | POA: Diagnosis not present

## 2023-01-19 DIAGNOSIS — M25519 Pain in unspecified shoulder: Secondary | ICD-10-CM | POA: Diagnosis not present

## 2023-01-19 DIAGNOSIS — R21 Rash and other nonspecific skin eruption: Secondary | ICD-10-CM | POA: Diagnosis not present

## 2023-01-19 DIAGNOSIS — E782 Mixed hyperlipidemia: Secondary | ICD-10-CM | POA: Diagnosis not present

## 2023-01-26 DIAGNOSIS — M25511 Pain in right shoulder: Secondary | ICD-10-CM | POA: Diagnosis not present

## 2023-01-30 DIAGNOSIS — M549 Dorsalgia, unspecified: Secondary | ICD-10-CM | POA: Diagnosis not present

## 2023-01-30 DIAGNOSIS — R457 State of emotional shock and stress, unspecified: Secondary | ICD-10-CM | POA: Diagnosis not present

## 2023-01-30 DIAGNOSIS — R001 Bradycardia, unspecified: Secondary | ICD-10-CM | POA: Diagnosis not present

## 2023-02-19 ENCOUNTER — Ambulatory Visit: Payer: BC Managed Care – PPO | Admitting: Neurology

## 2023-02-19 ENCOUNTER — Encounter: Payer: Self-pay | Admitting: Neurology

## 2023-02-19 VITALS — BP 124/70 | HR 76 | Ht 69.0 in | Wt 230.0 lb

## 2023-02-19 DIAGNOSIS — G479 Sleep disorder, unspecified: Secondary | ICD-10-CM

## 2023-02-19 DIAGNOSIS — G4719 Other hypersomnia: Secondary | ICD-10-CM | POA: Diagnosis not present

## 2023-02-19 DIAGNOSIS — G20A2 Parkinson's disease without dyskinesia, with fluctuations: Secondary | ICD-10-CM | POA: Diagnosis not present

## 2023-02-19 DIAGNOSIS — G20A1 Parkinson's disease without dyskinesia, without mention of fluctuations: Secondary | ICD-10-CM

## 2023-02-19 DIAGNOSIS — Z789 Other specified health status: Secondary | ICD-10-CM | POA: Diagnosis not present

## 2023-02-19 DIAGNOSIS — Z9189 Other specified personal risk factors, not elsewhere classified: Secondary | ICD-10-CM

## 2023-02-19 MED ORDER — CARBIDOPA-LEVODOPA 25-100 MG PO TABS: 1.5000 | ORAL_TABLET | Freq: Four times a day (QID) | ORAL | 3 refills | Status: DC

## 2023-02-19 NOTE — Progress Notes (Signed)
Subjective:    [name]. is a 62 y.o. male.  HPI    Interim history:   Wesley [name] is a 62 year old right-handed gentleman with an underlying medical history of hyperlipidemia, reflux disease, left hand pain and obesity, who presents for follow-up consultation of his left sided PD.  Wesley [name] is unaccompanied today.  Wesley [name] was last seen in a video visit by Shawnie Dapper, NP on 10/14/2022, at which time Wesley [name] was advised to continue with his medication regimen.  Wesley [name] mentioned intermittent double vision and was advised to schedule an eye appointment soon as possible.  Wesley [name] reported that Wesley [name] was planning early retirement at Wesley end of August 2024, due to downsizing and his company.  Wesley [name] reported that Wesley [name] was having a hard time typing Wesley left hand but was otherwise not limited.  Today, 02/19/2023: Wesley [name] reports that his tremor has become worse.  Wesley [name] also notices teeth chattering, which is intermittent and not bothersome.  Tremor is also on Wesley right side.  Wesley [name] does not sleep well.  Wesley [name] initially declines a sleep study and reports that Wesley [name] does not wish to have a mask.  Wesley [name] does not believe Wesley [name] snores.  His wife has not complained about it.  Wesley [name] denies nocturia but does have urinary frequency during Wesley day.  Wesley [name] has not addressed it with his PCP, Wesley [name] had a recent physical but did not mention his urinary complaints.  Wesley [name] had blood work at Wesley time which Wesley [name] states was good, I do not have blood test results available for my review.  Wesley [name] quit smoking about a week ago.  Wesley [name] tries to exercise regularly at Wesley gym, Wesley [name] uses a stationary step but not a stepper machine.  Wesley [name] does not have much in Wesley way of constipation.  Wesley [name] tries to hydrate well.  Wesley [name] drinks caffeine in Wesley form of coffee, 1 cup in Wesley morning and occasional unsweet or half and half tea.  Wesley [name] drinks alcohol daily.  Wesley [name] drinks red wine typically, up to 8 to 10 glasses/week on average.  Wesley [name] continues to take levodopa 1 pill 4 times daily and ropinirole 2 mg 3 times  daily.  Wesley [name] admits to eating in Wesley middle of Wesley night when Wesley [name] wakes up, typically snacking on sweet snacks.  His Epworth sleepiness score is 15 out of 24, fatigue severity score is 12 out of 63.  Wesley [name] saw an optometrist, maybe just an optician from his description in September 2024 and has a prescription for new eyeglasses.  Wesley [name] has not seen an ophthalmologist but would be willing to do so.  Wesley [name]'s allergies, current medications, family history, past medical history, past social history, past surgical history and problem list were reviewed and updated as appropriate.    Previously:   I saw Wesley [name] on 04/09/2022, at which time Wesley [name] reported doing overall well.  Wesley [name] was trying to exercise including weight training.  Wesley [name] did feel like Wesley [name] had pulled a muscle in his lower back while lifting weights.  Wesley [name] was counseled on smoking cessation at Wesley time.  Wesley [name] had intermittent constipation.  Wesley [name] was advised to continue with levodopa 1 pill 4 times a day, Wesley [name] was advised to continue with ropinirole 2 mg 3 times a day.    I saw Wesley [name] on 09/05/2021, at which time Wesley [name] felt fairly stable.  Wesley [name] was trying to be on time with his medication scheduled for propranolol and levodopa.  We talked about Wesley importance  of smoking cessation.  Wesley [name] was encouraged to limit his alcohol intake avoid drinking daily wine.  We also discussed other treatment options such as DBS and focused ultrasound treatment.  Wesley [name] was advised to continue with levodopa 4 times a day starting at 6:30 AM on a 4 hourly basis.  Wesley [name] was advised to continue with ropinirole 2 mg 3 times daily.        Wesley [name] missed an appointment on 12/03/20.  I saw Wesley [name] on 03/07/2021, at which time Wesley [name] reported interim weight gain.  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  I wrote a letter of support, however, Wesley [name] was advised to seek evaluation with an eye doctor.  Wesley [name] was advised to increase his Sinemet to 1 pill 4 times a day and continue with ropinirole 2 mg 3 times daily.    I saw Wesley [name] on 08/01/2020, at which  time we talked about Wesley importance of healthy lifestyle and constipation management.  Wesley [name] had gained weight secondary to excessive snacking.  We agreed to monitor this.  Wesley [name] was advised to continue with ropinirole and levodopa but encouraged to be very consistent with his medication regimen.       I saw Wesley [name] on 04/16/2020, at which time Wesley [name] was taking ropinirole 2 pills 3 times daily.  Wesley [name] admitted to snacking on cookies and milk.  Wesley [name] had gained weight in Wesley realm of 15 pounds.  Wesley [name] had seen Dr. Arbutus Leas for second opinion in January 2022.  Wesley [name] was advised to monitor his symptoms and his weight, we decided to continue with ropinirole 2 mg strength 3 times a day.  Wesley [name] was advised to start Sinemet with gradual titration.     I saw Wesley [name] on 12/08/2019, at which time Wesley [name] reported feeling about Wesley same.  Wesley [name] was able to tolerate Requip 1 mg strength 1 pill 3 times daily.  Wesley [name] has not noticed any telltale results from it, did not think his tremor was any better.  I advised Wesley [name] to increase Wesley ropinirole to 2 mg 3 times daily.   Wesley [name] had an interim appointment with Dr. Arbutus Leas on 03/15/2020 for second opinion of his Parkinson's disease.  Dr. Arbutus Leas concurred with Wesley diagnosis and talk to Wesley [name] about his weight gain and compulsive overeating.  Levodopa therapy was also discussed.     I saw Wesley [name] on 09/07/2019, at which time Wesley [name] felt stable.  Wesley [name] was working on smoking cessation. Wesley [name] was also advised to cut back on his daily alcohol consumption. Wesley [name] was advised regarding his DaTscan results and encouraged to start Requip low-dose with gradual titration.  Wesley [name] reached out in Wesley interim in August 2021 and reported that Wesley [name] had not noticed any telltale response.  Wesley [name] felt jittery and restless and was advised to increase Wesley Requip to 1 mg 3 times daily.     I first met Wesley [name] on 08/04/2019, at Wesley request of Elpidio Anis, PA in rheumatology, at which time Wesley [name] reported an approximately 64-month history of tremors affecting his left hand and stiffness and  pain in Wesley left wrist.  His findings were concerning for left-sided parkinsonism.  Wesley [name] was advised to proceed with a DaTscan for better diagnostic clarity.    Wesley [name] had a DaT scan on 08/31/19 and I reviewed Wesley results: IMPRESSION: Loss of activity in Wesley posterior striata and asymmetric decreased activity in Wesley head of Wesley RIGHT caudate nucleus. This pattern can be seen in Parkinsonian syndromes.   Of note, DaTSCAN is not diagnostic  of Parkinsonian syndromes, which remains a clinical diagnosis. DaTscan is an adjuvant test to aid in Wesley clinical diagnosis of Parkinsonian syndromes.   We called Wesley [name] with Wesley test result and arranged a FU appointment.      08/04/19: (Wesley [name]) reports an intermittent tremor affecting his left hand.  I reviewed your office note from 06/21/2019.   Wesley [name] has seen his primary care physician.  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 Wesley [name] has had an intermittent tremor for about 6 months and aching sensation and stiffness in Wesley left hand.  Wesley [name] denies any recent falls.  Wesley [name] does not have any tremor in Wesley lower body.  Wesley [name] does not have a family history of tremors or Parkinson's disease.  Wesley [name] had an x-ray of Wesley hand and wrist was tried on Mobic which did not provide any significant relief and also had a steroid shot.  Wesley [name] reports that his primary care physician told Wesley [name] it is not Parkinson's disease.  Wesley [name] reports some issue with his balance from time to time, no obvious changes in his walking or posture.  His wife has not noticed any obvious changes that she has mentioned.  Wesley [name] denies any sudden onset of one-sided weakness or numbness or tingling or droopy face or slurring of speech.  Wesley [name] feels that perhaps in Wesley past 6 months Wesley [name] has had some more fatigue.  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  Wesley [name] smokes cigarettes daily, less than 1 pack/day.  Wesley [name] drinks alcohol daily about 2 glasses of wine per day.  Wesley [name] has never had a brain MRI or CT scan.     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 Cornett, MD;  Location: [facility];  Service: General;  Laterality: N/A;   EXAMINATION UNDER ANESTHESIA N/A 09/30/2012   Procedure: EXAM UNDER ANESTHESIA;  Surgeon: Romie Levee, MD;  Location: [facility];  Service: General;  Laterality: N/A;   HIGH RESOLUTION ANOSCOPY N/A 09/30/2012   Procedure: HIGH RESOLUTION ANOSCOPY;  Surgeon: Romie Levee, MD;  Location: [facility];  Service: General;  Laterality: N/A;   INGUINAL HERNIA REPAIR  1980'S   LASER ABLATION CONDOLAMATA N/A 09/30/2012   Procedure: LASER ABLATION CONDOLAMATA;  Surgeon: Romie Levee, MD;  Location: [facility];  Service: General;  Laterality: N/A;   LASER ABLATION CONDOLAMATA N/A 02/02/2020   Procedure: LASER ABLATION CONDYLOMA;  Surgeon: Romie Levee, MD;  Location: [facility];  Service: General;  Laterality: N/A;   RECTAL EXAM UNDER ANESTHESIA N/A 02/02/2020   Procedure: RECTAL EXAM UNDER ANESTHESIA;  Surgeon: Romie Levee, MD;  Location: [facility];  Service: General;  Laterality: N/A;   WART FULGURATION  07/16/2011   Procedure: FULGURATION ANAL WART;  Surgeon: Clovis Pu. Cornett, MD;  Location: [facility];  Service: General;  Laterality: N/A;    His Family History Is Significant For: Family History  Problem Relation Age of Onset   Alzheimer's disease Mother    Heart disease Father    Parkinson's disease Neg Hx     His  Social History Is Significant For: Social History   Socioeconomic History   Marital status: Married     Spouse name: Not on file   Number of children: Not on file   Years of education: Not on file   Highest education level: Not on file  Occupational History   Not on file  Tobacco Use   Smoking status: Every Day    Current packs/day: 0.50    Average packs/day: 0.5 packs/day for 35.0 years (17.5 ttl pk-yrs)    Types: Cigarettes   Smokeless tobacco: Never   Tobacco comments:    Counseled on reduce to smoke  Vaping Use   Vaping status: Never Used  Substance and Sexual Activity   Alcohol use: Yes    Alcohol/week: 2.0 standard drinks of alcohol    Types: 2 Glasses of wine per week    Comment: 2 WINE PER DAY   Drug use: Never   Sexual activity: Not on file  Other Topics Concern   Not on file  Social History Narrative   Not on file   Social Drivers of Health   Financial Resource Strain: Not on file  Food Insecurity: Not on file  Transportation Needs: Not on file  Physical Activity: Not on file  Stress: Not on file  Social Connections: Not on file    His Allergies Are:  No Known Allergies:   His Current Medications Are:  Outpatient Encounter Medications as of 02/19/2023  Medication Sig   acetaminophen (TYLENOL) 325 MG tablet Take 650 mg by mouth every 6 (six) hours as needed.   carbidopa-levodopa (SINEMET IR) 25-100 MG tablet Take 1 tablet by mouth 4 (four) times daily. Follow instructions provided separately.   rOPINIRole (REQUIP) 2 MG tablet Take 1 tablet (2 mg total) by mouth 3 (three) times daily. Please note, change in pill size/dose.   simvastatin (ZOCOR) 40 MG tablet Take 40 mg by mouth daily. AM   No facility-administered encounter medications on file as of 02/19/2023.  :  Review of Systems:  Out of a complete 14 point review of systems, all are reviewed and negative with Wesley exception of these symptoms as listed below:   Review of Systems  Neurological:        Pt is here for Parkinson's follow up. Pt's states that Wesley [name] is noticing his teeth chattering. Pt states  that Wesley [name] is having tremors on both hands now.     Objective:  Neurological Exam  Physical Exam Physical Examination:   Vitals:   02/19/23 0745  BP: 124/70  Pulse: 76    General Examination: Wesley [name] is a very pleasant 62 y.o. male in no acute distress. Wesley [name] appears well-developed and well-nourished and well groomed.  Appears tired.  HEENT: Normocephalic, atraumatic, pupils are equal, round and reactive to light, tracking is well preserved, no nystagmus is seen. Wesley [name] has mild facial masking, mild to moderate nuchal rigidity, no significant hypophonia or dysarthria.  Slightly raspy voice.  Wesley [name] has an intermittent lower jaw tremor, no significant voice tremor.  Wesley [name] has mild to mouth dryness, mild airway crowding, Mallampati class III.  Neck circumference 19-1/2 inches.  No obvious sialorrhea, tongue protrudes centrally and palate elevates symmetrically.  Hearing is grossly intact.   Chest: Clear to auscultation without wheezing, rhonchi or crackles noted.   Heart: S1+S2+0, regular and normal without murmurs, rubs or gallops noted.    Abdomen: Soft, non-tender and non-distended.     Extremities: There is no pitting edema in Wesley distal lower  extremities bilaterally.    Skin: Warm and dry without trophic changes noted.   Musculoskeletal: exam reveals no obvious joint deformities, stable stiffness in his left hand.    Neurologically:  Mental status: Wesley [name] is awake, alert and oriented in all 4 spheres. His immediate and remote memory, attention, language skills and fund of knowledge are appropriate. There is no evidence of aphasia, agnosia, apraxia or anomia. Speech is clear with normal prosody and enunciation. Thought process is linear. Mood is normal and affect is normal.     (On 08/04/19: On Archimedes spiral drawing Wesley [name] has insecurity with his nondominant hand but no obvious trembling with either hand. Handwriting is legible, not particularly micrographic.)   Overall mild bradykinesia.   No dyskinesias noted.  No significant action tremor.     Cranial nerves II - XII are as described above under HEENT exam. In addition: right shoulder is slightly higher than left. Motor exam: Normal bulk, and strength is noted, tone is mildly increased in Wesley left upper extremity, particularly in Wesley wrist, with cogwheeling noted in Wesley wrist and elbow on Wesley left and slight increase in tone noted in Wesley right upper extremity without obvious cogwheeling, stable findings.  Wesley [name] has a mild to moderate resting tremor in Wesley left upper extremity, mild and intermittent resting tremor in Wesley right upper extremity.  No lower extremity tremor. Fine motor skills are mild to moderately impaired on Wesley left, better on Wesley right.   Wesley [name] stands without difficulty, posture is mildly stooped for age, Wesley [name] walks with fairly good stride length and pace but decreased arm swing on Wesley left.   Cerebellar testing: No dysmetria or intention tremor. There is no truncal or gait ataxia.  Sensory exam: intact to light touch.    Assessment and Plan:    In summary, Wesley [name]. is a 62 year old male with an underlying medical history of hyperlipidemia, reflux disease, left hand pain and obesity, who presents for follow-up consultation of his left sided predominant Parkinson's disease, complicated by sleep disturbance and intermittent constipation.  Symptoms dating back to early 2021. His DaTscan in June 2021 was supportive for an underlying parkinsonian syndrome.  Wesley [name] has been on ropinirole since July 2021. This was increased to 2 mg 3 times daily in October 2021.  Wesley [name] had a second opinion with Dr. Arbutus Leas at [facility] neurology for in January 2022.  Wesley [name] was noted to have weight increase and increased snacking, concern for compulsive eating.  Wesley [name] has had weight gain.   We had a long discussion again today about lifestyle modification and Wesley importance of following through.  Wesley [name] is strongly discouraged from drinking alcohol daily.  Alcohol is  a notoriously disrupter and could affect his balance adversely and does not mix well with his levodopa and ropinirole.  Wesley [name] is commended on his smoking cessation as of 1 week ago.  Wesley [name] is advised to not drink alcohol daily and use alcohol just as a treat or for special occasions.  Wesley [name] is advised to stay well-hydrated and continue to exercise regularly, avoid machines like a stepper machine.  We talked about dopamine dysregulation.  Wesley [name] is strongly reminded to avoid snacking at night.  His sleep is interrupted and Wesley [name] may be at risk for obstructive sleep apnea.  While Wesley [name] initially declined a sleep study, Wesley [name] is agreeable to pursuing a home sleep test at this time.  We talked about obstructive sleep apnea and treatment options including PAP therapy and inspire device.  Wesley [name] is advised that if Wesley [name] has obstructive sleep apnea based on a home sleep test I will likely recommend AutoPap therapy for Wesley [name].   We increased his levodopa to 1 pill 4 times a day in January 2023.  Wesley [name] is advised to increase it today to 1.5 pills 4 times a day and continue with ropinirole 2 mg strength 1 pill 3 times a day.  I am reluctant to increase it due to risk of dopamine dysregulation, and increase in sleepiness.  Wesley [name] already endorses daytime somnolence.  Wesley [name] is advised to follow-up in this clinic to see [name] Lomax in about 6 months, sooner if needed.  We will also advise Wesley [name] of his home sleep test results and proceed with sleep apnea treatment if indicated.  I answered all his questions today and Wesley [name] was in agreement. I spent 45 minutes in total face-to-face time and in reviewing records during pre-charting, more than 50% of which was spent in counseling and coordination of care, reviewing test results, reviewing medications and treatment regimen and/or in discussing or reviewing Wesley diagnosis of PD, OSA risk, Wesley prognosis and treatment options. Pertinent laboratory and imaging test results that were available during this visit with Wesley [name] were  reviewed by me and considered in my medical decision making (see chart for details).

## 2023-02-19 NOTE — Patient Instructions (Signed)
Please avoid drinking alcohol daily, alcohol can increase your risk for sleep disturbance, balance issues, and does not mix well with levodopa or ropinirole. I will order a home sleep test to evaluate you for obstructive sleep apnea.  If you have obstructive sleep apnea I will likely recommend treatment with a so-called AutoPap machine which is like a CPAP. Please continue to exercise regularly and hydrate well with water. Good job with smoking cessation, keep up this resolution. We will increase your levodopa to 1-1/2 pills 4 times a day at this time.

## 2023-03-23 DIAGNOSIS — M25511 Pain in right shoulder: Secondary | ICD-10-CM | POA: Diagnosis not present

## 2023-03-26 ENCOUNTER — Ambulatory Visit: Payer: Self-pay | Admitting: Neurology

## 2023-03-26 DIAGNOSIS — G4733 Obstructive sleep apnea (adult) (pediatric): Secondary | ICD-10-CM

## 2023-03-26 DIAGNOSIS — G4719 Other hypersomnia: Secondary | ICD-10-CM

## 2023-03-26 DIAGNOSIS — G479 Sleep disorder, unspecified: Secondary | ICD-10-CM

## 2023-03-26 DIAGNOSIS — R0683 Snoring: Secondary | ICD-10-CM

## 2023-04-15 NOTE — Progress Notes (Unsigned)
Marland Kitchen

## 2023-04-16 ENCOUNTER — Encounter: Payer: Self-pay | Admitting: Neurology

## 2023-04-16 NOTE — Procedures (Signed)
   [facility]  HOME SLEEP TEST (SANSA) REPORT (Mail-Out Device):   STUDY DATE: 04/05/23  DOB: [birth year]  MRN: [id number]  ORDERING CLINICIAN: [name], MD, PhD   REFERRING CLINICIAN: Daisy Floro, MD   CLINICAL INFORMATION/HISTORY: 63 year old right-handed gentleman with an underlying medical history of hyperlipidemia, reflux disease, left hand pain and obesity, who reports sleep disruption and daytime somnolence.  PATIENT'S LAST REPORTED EPWORTH SLEEPINESS SCORE (ESS): 15/24.  BMI (at the time of sleep clinic visit and/or test date): 34 kg/m  FINDINGS:   Study Protocol:    The SANSA single-point-of-skin-contact chest-worn sensor - an FDA cleared and DOT approved type 4 home sleep test device - measures eight physiological channels,  including blood oxygen saturation (measured via PPG [photoplethysmography]), EKG-derived heart rate, respiratory effort, chest movement (measured via accelerometer), snoring, body position, and actigraphy. The device is designed to be worn for up to 10 hours per study.   Sleep Summary:   Total Recording Time (hours, min): 7 hours, 22 min  Total Effective Sleep Time (hours, min):  6 hours, 18 min  Sleep Efficiency (%):    86%   Respiratory Indices:   Calculated sAHI (per hour):  0.5/hour         Oxygen Saturation Statistics:    Oxygen Saturation (%) Mean: 93.9%   Minimum oxygen saturation (%):                 84.4%   O2 Saturation Range (%): 84.4 - 99.9%   Time below or at 88% saturation: 0 min   Pulse Rate Statistics:   Pulse Mean (bpm):    68/min    Pulse Range (58 - 91/min)   Snoring: Mild to loud  IMPRESSION/DIAGNOSES:     Primary snoring    RECOMMENDATIONS:   This home sleep test does not demonstrate any significant obstructive or central sleep disordered breathing with a total AHI of less than 5/hour. His total AHI was 0.5/hour, O2 nadir of 84.4%.  Snoring was detected, ranging from mild  to louder. Treatment with a positive airway pressure device such as AutoPap or CPAP is not indicated based on this test. Snoring may improve with avoidance of the supine sleep position and weight loss (where clinically appropriate). For disturbing snoring, an oral appliance through dentistry or orthodontics can be considered.  Other causes of the patient's symptoms, including circadian rhythm disturbances, an underlying mood disorder, medication effect and/or an underlying medical problem cannot be ruled out based on this test. Clinical correlation is recommended.  The patient should be cautioned not to drive, work at heights, or operate dangerous or heavy equipment when tired or sleepy. Review and reiteration of good sleep hygiene measures should be pursued with any patient. The patient will be advised to follow up with his referring provider, who will be notified of the test results.   I certify that I have reviewed the raw data recording prior to the issuance of this report in accordance with the standards of the American Academy of Sleep Medicine (AASM).    INTERPRETING PHYSICIAN:   [name], MD, PhD Medical Director, [facility]) Diplomat, ABPN (Neurology and Sleep)   [facility] [address], [address] [place], [place] [address] [phone]

## 2023-04-20 DIAGNOSIS — L729 Follicular cyst of the skin and subcutaneous tissue, unspecified: Secondary | ICD-10-CM | POA: Diagnosis not present

## 2023-04-20 DIAGNOSIS — G20C Parkinsonism, unspecified: Secondary | ICD-10-CM | POA: Diagnosis not present

## 2023-04-20 DIAGNOSIS — M5412 Radiculopathy, cervical region: Secondary | ICD-10-CM | POA: Diagnosis not present

## 2023-05-24 ENCOUNTER — Other Ambulatory Visit: Payer: Self-pay | Admitting: Neurology

## 2023-05-25 ENCOUNTER — Telehealth: Payer: Self-pay | Admitting: Neurology

## 2023-05-25 ENCOUNTER — Other Ambulatory Visit: Payer: Self-pay

## 2023-05-25 NOTE — Telephone Encounter (Signed)
 Pt checking on status of refill for rOPINIRole (REQUIP) 2 MG tablet

## 2023-05-25 NOTE — Telephone Encounter (Signed)
 I just sent in to pharmacy.

## 2023-06-07 DIAGNOSIS — E785 Hyperlipidemia, unspecified: Secondary | ICD-10-CM | POA: Insufficient documentation

## 2023-06-07 DIAGNOSIS — I2510 Atherosclerotic heart disease of native coronary artery without angina pectoris: Secondary | ICD-10-CM | POA: Insufficient documentation

## 2023-06-07 DIAGNOSIS — I700 Atherosclerosis of aorta: Secondary | ICD-10-CM | POA: Insufficient documentation

## 2023-06-07 NOTE — Progress Notes (Unsigned)
 Cardiology Office Note   Date:  06/08/2023   ID:  Wesley Canterbury., DOB 1961/02/05, MRN [id number]  PCP:  Daisy Floro, MD  Cardiologist:   None Referring:  Daisy Floro, MD  No chief complaint on file.     History of Present Illness: Wesley Stene. is a 63 y.o. male who presents for evaluation of an abnormal CT.  He was referred by Daisy Floro, MD.   He was found to have two vessel calcium.  He has aortic atherosclerosis.      He actually had screening CTs for lung cancer serially and I go back to 2019 and I see coronary calcium.  He does have risk factors.  He has ongoing tobacco use.  He has a family history.  He is also being managed for Parkinson's.  He has had progressive fatigue over 4 to 5 months.  He has had discomfort going into his right shoulder and up his right neck.  This seems to happen somewhat sporadically but with activity.  He does get fatigued easily now with activity.  He does try to do stuff like when out and spread pine needles.  He is not describing substernal pain.  He is not having any new shortness of breath, PND or orthopnea.  Has had no new palpitations, presyncope or syncope.  Has had no prior cardiac workup other than stress testing many years ago.   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 Cornett, MD;  Location: [facility];  Service: General;  Laterality: N/A;   EXAMINATION UNDER ANESTHESIA N/A 09/30/2012   Procedure: EXAM UNDER ANESTHESIA;  Surgeon: Romie Levee, MD;  Location: [facility];  Service: General;  Laterality: N/A;   HIGH RESOLUTION ANOSCOPY N/A 09/30/2012   Procedure: HIGH RESOLUTION ANOSCOPY;  Surgeon:  Romie Levee, MD;  Location: [facility];  Service: General;  Laterality: N/A;   INGUINAL HERNIA REPAIR  1980'S   LASER ABLATION CONDOLAMATA N/A 09/30/2012   Procedure: LASER ABLATION CONDOLAMATA;  Surgeon: Romie Levee, MD;  Location: [facility] [place];  Service: General;  Laterality: N/A;   LASER ABLATION CONDOLAMATA N/A 02/02/2020   Procedure: LASER ABLATION CONDYLOMA;  Surgeon: Romie Levee, MD;  Location: [facility] [place];  Service: General;  Laterality: N/A;   RECTAL EXAM UNDER ANESTHESIA N/A 02/02/2020   Procedure: RECTAL EXAM UNDER ANESTHESIA;  Surgeon: Romie Levee, MD;  Location: [facility];  Service: General;  Laterality: N/A;   WART FULGURATION  07/16/2011   Procedure: FULGURATION ANAL WART;  Surgeon: Clovis Pu. Cornett, MD;  Location: [facility];  Service: General;  Laterality: N/A;     Current Outpatient Medications  Medication Sig Dispense Refill   acetaminophen (TYLENOL) 325 MG tablet Take 650 mg by mouth every 6 (six) hours as needed.     carbidopa-levodopa (SINEMET IR) 25-100 MG tablet Take 1.5 tablets by mouth 4 (four) times daily. 540 tablet 3   rOPINIRole (REQUIP) 2 MG tablet Take 1 tablet (2 mg total) by mouth 3 (three) times daily. 270 tablet 3   simvastatin (ZOCOR) 40 MG tablet Take 40 mg by mouth daily. AM  No current facility-administered medications for this visit.    Allergies:   [name] has no known allergies.    Social History:  Wesley [name]  reports that he has been smoking cigarettes. He has a 17.5 pack-year smoking history. He has never used smokeless tobacco. He reports current alcohol use of about 2.0 standard drinks of alcohol per week. He reports that he does not use drugs.   Family History:  Wesley [name]'s family history includes Alzheimer's disease in his mother; Heart disease in his father.    ROS:  Please see Wesley history of present illness.   Otherwise, review of systems are  positive for none.   All other systems are reviewed and negative.    PHYSICAL EXAM: VS:  BP 114/74 (BP Location: Left Arm, [name] Position: Sitting, Cuff Size: Normal)   Pulse 68   Ht 5\' 8"  (1.727 m)   Wt 227 lb 9.6 oz (103.2 kg)   SpO2 93%   BMI 34.61 kg/m  , BMI Body mass index is 34.61 kg/m. GENERAL:  Well appearing HEENT:  Pupils equal round and reactive, fundi not visualized, oral mucosa unremarkable NECK:  No jugular venous distention, waveform within normal limits, carotid upstroke brisk and symmetric, no bruits, no thyromegaly LYMPHATICS:  No cervical, inguinal adenopathy LUNGS:  Clear to auscultation bilaterally BACK:  No CVA tenderness CHEST:  Unremarkable HEART:  PMI not displaced or sustained,S1 and S2 within normal limits, no S3, no S4, no clicks, no rubs, no murmurs ABD:  Flat, positive bowel sounds normal in frequency in pitch, no bruits, no rebound, no guarding, no midline pulsatile mass, no hepatomegaly, no splenomegaly EXT:  2 plus pulses throughout, no edema, no cyanosis no clubbing SKIN:  No rashes no nodules NEURO:  Cranial nerves II through XII grossly intact, motor grossly intact throughout [facility]:  Cognitively intact, oriented to person place and time    EKG:  EKG Interpretation Date/Time:  Monday June 08 2023 09:41:14 EDT Ventricular Rate:  64 PR Interval:  184 QRS Duration:  96 QT Interval:  430 QTC Calculation: 443 R Axis:   86  Text Interpretation: Normal sinus rhythm Normal ECG When compared with ECG of 27-Nov-2015 20:22, No significant change was found Confirmed by Rollene Rotunda ([id number]) on 06/08/2023 9:42:56 AM     Recent Labs: No results found for requested labs within last 365 days.    Lipid Panel No results found for: "CHOL", "TRIG", "HDL", "CHOLHDL", "VLDL", "LDLCALC", "LDLDIRECT"    Wt Readings from Last 3 Encounters:  06/08/23 227 lb 9.6 oz (103.2 kg)  02/19/23 230 lb (104.3 kg)  04/09/22 231 lb 9.6 oz (105.1 kg)      Other  studies Reviewed: Additional studies/ records that were reviewed today include: Labs. Review of Wesley above records demonstrates:  Please see elsewhere in Wesley note.     ASSESSMENT AND PLAN:  Coronary calcium: Wesley [name] does have coronary artery disease as documented.  He has some symptoms that could be an anginal equivalent.  I am going to perform an exercise Myoview.  He needs aggressive primary risk reduction.  Aortic atherosclerosis: He will have aggressive primary risk reduction as below.  Dyslipidemia: I want his goal LDL to be in Wesley 50s.  And stop his simvastatin and start rosuvastatin 40 mg daily with a lipid profile in 3 months.  He will also get an LP(a).  Tobacco: He does have nicotine patches and I encouraged that he use these.  He can talk to his primary  about Chantix as well.   Current medicines are reviewed at length with Wesley [name] today.  Wesley [name] does not have concerns regarding medicines.  Wesley following changes have been made: As above  Labs/ tests ordered today include:   Orders Placed This Encounter  Procedures   EKG 12-Lead     Disposition:   FU with with me as needed based on future symptoms or Wesley results of Wesley above.   Signed, Rollene Rotunda, MD  06/08/2023 9:49 AM    [facility]

## 2023-06-08 ENCOUNTER — Encounter: Payer: Self-pay | Admitting: Cardiology

## 2023-06-08 ENCOUNTER — Ambulatory Visit: Payer: Self-pay | Attending: Cardiology | Admitting: Cardiology

## 2023-06-08 VITALS — BP 114/74 | HR 68 | Ht 68.0 in | Wt 227.6 lb

## 2023-06-08 DIAGNOSIS — I2510 Atherosclerotic heart disease of native coronary artery without angina pectoris: Secondary | ICD-10-CM

## 2023-06-08 DIAGNOSIS — I700 Atherosclerosis of aorta: Secondary | ICD-10-CM

## 2023-06-08 DIAGNOSIS — R5383 Other fatigue: Secondary | ICD-10-CM | POA: Diagnosis not present

## 2023-06-08 DIAGNOSIS — E785 Hyperlipidemia, unspecified: Secondary | ICD-10-CM | POA: Diagnosis not present

## 2023-06-08 MED ORDER — ROSUVASTATIN CALCIUM 40 MG PO TABS: 40.0000 mg | ORAL_TABLET | Freq: Every day | ORAL | 3 refills | Status: AC

## 2023-06-08 NOTE — Patient Instructions (Signed)
 Medication Instructions:  STOP simvastatin   START rosuvastatin 40mg  once daily for cholesterol   *If you need a refill on your cardiac medications before your next appointment, please call your pharmacy*  Lab Work: FASTING lab work in 3 months to check cholesterol - lipid panel, LPa  If you have labs (blood work) drawn today and your tests are completely normal, you will receive your results only by: MyChart Message (if you have MyChart) OR A paper copy in the mail If you have any lab test that is abnormal or we need to change your treatment, we will call you to review the results.  Testing/Procedures: Dr. Antoine Poche has ordered a Myocardial Perfusion Imaging Study.   The test will take approximately 3 to 4 hours to complete; you may bring reading material.  If someone comes with you to your appointment, they will need to remain in the main lobby due to limited space in the testing area. **If you are pregnant or breastfeeding, please notify the nuclear lab prior to your appointment**   How to prepare for your Myocardial Perfusion Test: Do not eat or drink 3 hours prior to your test, except you may have water. Do not consume products containing caffeine (regular or decaffeinated) 12 hours prior to your test. (ex: coffee, chocolate, sodas, tea). Do wear comfortable clothes (no dresses or overalls) and walking shoes, tennis shoes preferred (No heels or open toe shoes are allowed). Do NOT wear cologne, perfume, aftershave, or lotions (deodorant is allowed). If these instructions are not followed, your test will have to be rescheduled.   Follow-Up: At [facility], you and your health needs are our priority.  As part of our continuing mission to provide you with exceptional heart care, our providers are all part of one team.  This team includes your primary Cardiologist (physician) and Advanced Practice Providers or APPs (Physician Assistants and Nurse Practitioners) who all work  together to provide you with the care you need, when you need it.  Your next appointment:   AS NEEDED with Dr. Antoine Poche, pending stress test results  We recommend signing up for the patient portal called "MyChart".  Sign up information is provided on this After Visit Summary.  MyChart is used to connect with patients for Virtual Visits (Telemedicine).  Patients are able to view lab/test results, encounter notes, upcoming appointments, etc.  Non-urgent messages can be sent to your provider as well.   To learn more about what you can do with MyChart, go to [email or web address].     1st Floor: - Lobby - Registration  - Pharmacy  - Lab - Cafe  2nd Floor: - PV Lab - Diagnostic Testing (echo, CT, nuclear med)  3rd Floor: - Vacant  4th Floor: - TCTS (cardiothoracic surgery) - AFib Clinic - Structural Heart Clinic - Vascular Surgery  - Vascular Ultrasound  5th Floor: - HeartCare Cardiology (general and EP) - Clinical Pharmacy for coumadin, hypertension, lipid, weight-loss medications, and med management appointments    Valet parking services will be available as well.

## 2023-06-09 ENCOUNTER — Encounter (HOSPITAL_COMMUNITY): Payer: Self-pay

## 2023-06-15 ENCOUNTER — Telehealth (HOSPITAL_COMMUNITY): Payer: Self-pay | Admitting: *Deleted

## 2023-06-15 NOTE — Telephone Encounter (Signed)
 Patient given detailed instructions per Myocardial Perfusion Study Information Sheet for the test on 06/17/23 Patient notified to arrive 15 minutes early and that it is imperative to arrive on time for appointment to keep from having the test rescheduled.  If you need to cancel or reschedule your appointment, please call the office within 24 hours of your appointment. . Patient verbalized understanding.Wesley [name]

## 2023-06-17 ENCOUNTER — Ambulatory Visit (HOSPITAL_COMMUNITY): Attending: Cardiology

## 2023-06-17 DIAGNOSIS — R5383 Other fatigue: Secondary | ICD-10-CM | POA: Insufficient documentation

## 2023-06-17 DIAGNOSIS — E785 Hyperlipidemia, unspecified: Secondary | ICD-10-CM | POA: Diagnosis not present

## 2023-06-17 DIAGNOSIS — I2510 Atherosclerotic heart disease of native coronary artery without angina pectoris: Secondary | ICD-10-CM | POA: Diagnosis not present

## 2023-06-17 LAB — MYOCARDIAL PERFUSION IMAGING
Base ST Depression (mm): 0 mm
LV dias vol: 105 mL (ref 62–150)
LV sys vol: 38 mL
Nuc Stress EF: 64 %
Peak HR: 125 {beats}/min
Rest HR: 55 {beats}/min
Rest Nuclear Isotope Dose: 10.3 mCi
SDS: 0
SRS: 0
SSS: 0
ST Depression (mm): 0 mm
Stress Nuclear Isotope Dose: 32.4 mCi
TID: 1.02

## 2023-06-17 MED ORDER — TECHNETIUM TC 99M TETROFOSMIN IV KIT
32.4000 | PACK | Freq: Once | INTRAVENOUS | Status: AC | PRN
  Administered 2023-06-17: 32.4 via INTRAVENOUS

## 2023-06-17 MED ORDER — REGADENOSON 0.4 MG/5ML IV SOLN
0.4000 mg | Freq: Once | INTRAVENOUS | Status: AC
  Administered 2023-06-17: 0.4 mg via INTRAVENOUS

## 2023-06-17 MED ORDER — TECHNETIUM TC 99M TETROFOSMIN IV KIT
10.3000 | PACK | Freq: Once | INTRAVENOUS | Status: AC | PRN
  Administered 2023-06-17: 10.3 via INTRAVENOUS

## 2023-06-19 ENCOUNTER — Encounter: Payer: Self-pay | Admitting: *Deleted

## 2023-07-07 ENCOUNTER — Other Ambulatory Visit: Payer: Self-pay | Admitting: *Deleted

## 2023-07-07 DIAGNOSIS — G20A1 Parkinson's disease without dyskinesia, without mention of fluctuations: Secondary | ICD-10-CM

## 2023-07-07 MED ORDER — CARBIDOPA-LEVODOPA 25-100 MG PO TABS: 1.5000 | ORAL_TABLET | Freq: Four times a day (QID) | ORAL | 3 refills | Status: AC

## 2023-07-13 DIAGNOSIS — A630 Anogenital (venereal) warts: Secondary | ICD-10-CM | POA: Diagnosis not present

## 2023-07-15 ENCOUNTER — Encounter: Payer: Self-pay | Admitting: Neurology

## 2023-07-15 DIAGNOSIS — J439 Emphysema, unspecified: Secondary | ICD-10-CM | POA: Insufficient documentation

## 2023-07-15 DIAGNOSIS — E782 Mixed hyperlipidemia: Secondary | ICD-10-CM | POA: Insufficient documentation

## 2023-07-15 DIAGNOSIS — I700 Atherosclerosis of aorta: Secondary | ICD-10-CM | POA: Insufficient documentation

## 2023-07-15 DIAGNOSIS — K429 Umbilical hernia without obstruction or gangrene: Secondary | ICD-10-CM | POA: Insufficient documentation

## 2023-07-15 DIAGNOSIS — E559 Vitamin D deficiency, unspecified: Secondary | ICD-10-CM | POA: Insufficient documentation

## 2023-07-15 DIAGNOSIS — H9319 Tinnitus, unspecified ear: Secondary | ICD-10-CM | POA: Insufficient documentation

## 2023-07-15 DIAGNOSIS — Z79899 Other long term (current) drug therapy: Secondary | ICD-10-CM | POA: Insufficient documentation

## 2023-07-15 DIAGNOSIS — G479 Sleep disorder, unspecified: Secondary | ICD-10-CM | POA: Insufficient documentation

## 2023-07-15 DIAGNOSIS — F430 Acute stress reaction: Secondary | ICD-10-CM | POA: Insufficient documentation

## 2023-07-15 DIAGNOSIS — Z8249 Family history of ischemic heart disease and other diseases of the circulatory system: Secondary | ICD-10-CM | POA: Insufficient documentation

## 2023-07-15 DIAGNOSIS — F17200 Nicotine dependence, unspecified, uncomplicated: Secondary | ICD-10-CM | POA: Insufficient documentation

## 2023-07-15 HISTORY — DX: Emphysema, unspecified: J43.9

## 2023-07-16 ENCOUNTER — Institutional Professional Consult (permissible substitution): Payer: 59 | Admitting: Neurology

## 2023-07-22 DIAGNOSIS — K5900 Constipation, unspecified: Secondary | ICD-10-CM | POA: Diagnosis not present

## 2023-07-22 DIAGNOSIS — Z6834 Body mass index (BMI) 34.0-34.9, adult: Secondary | ICD-10-CM | POA: Diagnosis not present

## 2023-07-22 DIAGNOSIS — K649 Unspecified hemorrhoids: Secondary | ICD-10-CM | POA: Diagnosis not present

## 2023-08-05 ENCOUNTER — Ambulatory Visit (INDEPENDENT_AMBULATORY_CARE_PROVIDER_SITE_OTHER): Admitting: Neurology

## 2023-08-05 ENCOUNTER — Encounter: Payer: Self-pay | Admitting: Neurology

## 2023-08-05 ENCOUNTER — Telehealth: Payer: Self-pay | Admitting: Neurology

## 2023-08-05 VITALS — BP 138/80 | HR 60 | Ht 68.0 in | Wt 223.0 lb

## 2023-08-05 DIAGNOSIS — K5909 Other constipation: Secondary | ICD-10-CM

## 2023-08-05 DIAGNOSIS — M5412 Radiculopathy, cervical region: Secondary | ICD-10-CM

## 2023-08-05 DIAGNOSIS — G20A2 Parkinson's disease without dyskinesia, with fluctuations: Secondary | ICD-10-CM | POA: Diagnosis not present

## 2023-08-05 NOTE — Telephone Encounter (Signed)
 sent to GI they obtain Drucie Opitz [phone]

## 2023-08-05 NOTE — Patient Instructions (Addendum)
 Constipation can be a big problem in advanced or advancing Parkinson's disease. It is important to be proactive: this includes ensuring adequate water intake and mobilization (walking around), utilizing stool softeners as needed, an over-the-counter laxative as needed up to daily if needed, adding a probiotic in pill form or in the form of yogurt can help as well. Sometimes, using a suppository or enema becomes necessary.  You may benefit form seeing a GI specialist, especially, if taking Miralax daily does not help.  We will do an EMG and nerve conduction velocity test, which is an electrical nerve and muscle test, which we will schedule. We will call you with the results.  We will do a cervical spine (i.e. neck) MRI to look for degenerative changes. Depending on the results, you may benefit from seeing a spine specialist.   Please keep your appointment with [name] next week.  We may consider increasing your Parkinson's medication at the time.

## 2023-08-05 NOTE — Progress Notes (Signed)
 Subjective:    [name]. is a 63 y.o. male.  HPI    Interim history:   Wesley [name] is a 63 year old right-handed gentleman with an underlying medical history of hyperlipidemia, Parkinson's disease, reflux disease, left hand pain and obesity, who presents for a new problem visit of neck pain.  He is referred by his primary care physician, Dr. Avanell [name].  He is accompanied by his wife today.  He was last seen in this clinic for his Parkinson's disease in December 2020 for, at which time He saw Dr. Avanell [name] on 04/20/2023 and reported pain in his neck and right arm as well as shoulder pain.  He was treated with a prednisone taper.  He has not had any cervical imaging test done.  He is scheduled to see Wesley Fick, NP next week for his PD follow-up.  Today, 08/05/2023: He reports a several month history of right-sided neck pain.  It radiates to Wesley shoulder.  Several months ago, he saw orthopedics for his right shoulder and had an x-ray and reports that there was no significant arthritis in Wesley shoulder but he has not had any neck imaging.  He feels that Wesley pain is probably a little bit better since completing Wesley steroid treatment.  He took a 2-week course back in mid February.  His wife provides additional history.  She is worried about his balance.  He has had worsening tremor.  He continues to take Sinemet  1-1/2 pills 4 times a day and takes ropinirole .  He has some additional restless leg symptoms.  He feels that his balance is worse and his tremor is worse per his report as well.  He has had worsening constipation, takes MiraLAX as needed, not daily.  Had an issue with hemorrhoid a few weeks back.  He has not seen GI.  Pain in Wesley neck does not radiate to Wesley arm or hand, he does not feel any weakness in Wesley right hand but has noticed a tremor on Wesley right side as well.   Wesley [name]'s allergies, current medications, family history, past medical history, past social history, past surgical  history and problem list were reviewed and updated as appropriate.     Previously:    02/19/2023: (He) presents for follow-up consultation of his left sided PD.  Wesley [name] is unaccompanied today.  He was last seen in a video visit by Wesley Fick, NP on 10/14/2022, at which time he was advised to continue with his medication regimen.  He mentioned intermittent double vision and was advised to schedule an eye appointment soon as possible.  He reported that he was planning early retirement at Wesley end of August 2024, due to downsizing and his company.  He reported that he was having a hard time typing Wesley left hand but was otherwise not limited.   He reported that his tremor was worse.  He reports that his tremor has become worse.  He also notices teeth chattering, which is intermittent and not bothersome.  Tremor is also on Wesley right side.  He does not sleep well.  He initially declines a sleep study and reports that he does not wish to have a mask.  He does not believe he snores.  His wife has not complained about it.  He denies nocturia but does have urinary frequency during Wesley day.  He has not addressed it with his PCP, he had a recent physical but did not mention his urinary complaints.  He had  blood work at Wesley time which he states was good, I do not have blood test results available for my review.  He quit smoking about a week ago.  He tries to exercise regularly at Wesley gym, he uses a stationary step but not a stepper machine.  He does not have much in Wesley way of constipation.  He tries to hydrate well.  He drinks caffeine in Wesley form of coffee, 1 cup in Wesley morning and occasional unsweet or half and half tea.  He drinks alcohol daily.  He drinks red wine typically, up to 8 to 10 glasses/week on average.  He continues to take levodopa  1 pill 4 times daily and ropinirole  2 mg 3 times daily.  He admits to eating in Wesley middle of Wesley night when he wakes up, typically snacking on sweet snacks.  His Epworth  sleepiness score is 15 out of 24, fatigue severity score is 12 out of 63.  He saw an optometrist, maybe just an optician from his description in September 2024 and has a prescription for new eyeglasses.  He has not seen an ophthalmologist but would be willing to do so.     I saw him on 04/09/2022, at which time he reported doing overall well.  He was trying to exercise including weight training.  He did feel like he had pulled a muscle in his lower back while lifting weights.  He was counseled on smoking cessation at Wesley time.  He had intermittent constipation.  He was advised to continue with levodopa  1 pill 4 times a day, he was advised to continue with ropinirole  2 mg 3 times a day.      I saw him on 09/05/2021, at which time he felt fairly stable.  He was trying to be on time with his medication scheduled for propranolol and levodopa .  We talked about Wesley importance of smoking cessation.  He was encouraged to limit his alcohol intake avoid drinking daily wine.  We also discussed other treatment options such as DBS and focused ultrasound treatment.  He was advised to continue with levodopa  4 times a day starting at 6:30 AM on a 4 hourly basis.  He was advised to continue with ropinirole  2 mg 3 times daily.         He missed an appointment on 12/03/20.  I saw him on 03/07/2021, at which time he reported interim weight gain.  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  I wrote a letter of support, however, he was advised to seek evaluation with an eye doctor.  He was advised to increase his Sinemet  to 1 pill 4 times a day and continue with ropinirole  2 mg 3 times daily.    I saw him on 08/01/2020, at which time we talked about Wesley importance of healthy lifestyle and constipation management.  He had gained weight secondary to excessive snacking.  We agreed to monitor this.  He was advised to continue with ropinirole  and levodopa  but encouraged to be very consistent with his medication regimen.       I saw him on 04/16/2020,  at which time he was taking ropinirole  2 pills 3 times daily.  He admitted to snacking on cookies and milk.  He had gained weight in Wesley realm of 15 pounds.  He had seen Dr. Winferd Hatter for second opinion in January 2022.  He was advised to monitor his symptoms and his weight, we decided to continue with ropinirole  2 mg strength 3 times a  day.  He was advised to start Sinemet  with gradual titration.     I saw him on 12/08/2019, at which time he reported feeling about Wesley same.  He was able to tolerate Requip  1 mg strength 1 pill 3 times daily.  He has not noticed any telltale results from it, did not think his tremor was any better.  I advised him to increase Wesley ropinirole  to 2 mg 3 times daily.   He had an interim appointment with Dr. Winferd Hatter on 03/15/2020 for second opinion of his Parkinson's disease.  Dr. Winferd Hatter concurred with Wesley diagnosis and talk to him about his weight gain and compulsive overeating.  Levodopa  therapy was also discussed.     I saw him on 09/07/2019, at which time he felt stable.  He was working on smoking cessation. He was also advised to cut back on his daily alcohol consumption. He was advised regarding his DaTscan  results and encouraged to start Requip  low-dose with gradual titration.  He reached out in Wesley interim in August 2021 and reported that he had not noticed any telltale response.  He felt jittery and restless and was advised to increase Wesley Requip  to 1 mg 3 times daily.     I first met him on 08/04/2019, at Wesley request of Roslyn Coombe, PA in rheumatology, at which time Wesley [name] reported an approximately 71-month history of tremors affecting his left hand and stiffness and pain in Wesley left wrist.  His findings were concerning for left-sided parkinsonism.  He was advised to proceed with a DaTscan  for better diagnostic clarity.    He had a DaT scan on 08/31/19 and I reviewed Wesley results: IMPRESSION: Loss of activity in Wesley posterior striata and asymmetric decreased activity in Wesley head of  Wesley RIGHT caudate nucleus. This pattern can be seen in Parkinsonian syndromes.   Of note, DaTSCAN  is not diagnostic of Parkinsonian syndromes, which remains a clinical diagnosis. DaTscan  is an adjuvant test to aid in Wesley clinical diagnosis of Parkinsonian syndromes.   We called him with Wesley test result and arranged a FU appointment.      08/04/19: (He) reports an intermittent tremor affecting his left hand.  I reviewed your office note from 06/21/2019.   He has seen his primary care physician.  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 He has had an intermittent tremor for about 6 months and aching sensation and stiffness in Wesley left hand.  He denies any recent falls.  He does not have any tremor in Wesley lower body.  He does not have a family history of tremors or Parkinson's disease.  He had an x-ray of Wesley hand and wrist was tried on Mobic which did not provide any significant relief and also had a steroid shot.  He reports that his primary care physician told him it is not Parkinson's disease.  Wesley [name] reports some issue with his balance from time to time, no obvious changes in his walking or posture.  His wife has not noticed any obvious changes that she has mentioned.  He denies any sudden onset of one-sided weakness or numbness or tingling or droopy face or slurring of speech.  He feels that perhaps in Wesley past 6 months he has had some more fatigue.  He works full-time as a Medical sales representative.  He smokes cigarettes daily, less than 1 pack/day.  He drinks alcohol daily about 2 glasses of wine per day.  He has never had a brain  MRI or CT scan.      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 Cornett, MD;  Location: [facility];  Service: General;  Laterality: N/A;   EXAMINATION UNDER ANESTHESIA N/A 09/30/2012   Procedure: EXAM UNDER ANESTHESIA;  Surgeon: Joyce Nixon, MD;  Location: [facility] [place];  Service: General;  Laterality: N/A;   HIGH RESOLUTION ANOSCOPY N/A 09/30/2012   Procedure: HIGH RESOLUTION ANOSCOPY;  Surgeon: Joyce Nixon, MD;  Location: [facility];  Service: General;  Laterality: N/A;   INGUINAL HERNIA REPAIR  1980'S   LASER ABLATION CONDOLAMATA N/A 09/30/2012   Procedure: LASER ABLATION CONDOLAMATA;  Surgeon: Joyce Nixon, MD;  Location: [facility] [place];  Service: General;  Laterality: N/A;   LASER ABLATION CONDOLAMATA N/A 02/02/2020   Procedure: LASER ABLATION CONDYLOMA;  Surgeon: Joyce Nixon, MD;  Location: [facility] [place];  Service: General;  Laterality: N/A;   RECTAL EXAM UNDER ANESTHESIA N/A 02/02/2020   Procedure: RECTAL EXAM UNDER ANESTHESIA;  Surgeon: Joyce Nixon, MD;  Location: [facility];  Service: General;  Laterality: N/A;   WART FULGURATION  07/16/2011   Procedure: FULGURATION ANAL WART;  Surgeon: Brandy Cal. Cornett, MD;  Location: [facility];  Service: General;  Laterality: N/A;    His Family History Is Significant For: Family History  Problem Relation Age of Onset   Alzheimer's disease Mother    Heart disease Father        CABG late 80s.   Parkinson's disease Neg Hx     His Social History Is Significant For: Social History   Socioeconomic History   Marital status: Married    Spouse name: Not on file   Number of children: Not on file   Years of education: Not on file   Highest education level: Not on file  Occupational History   Not on file  Tobacco Use   Smoking status: Every Day    Current packs/day: 0.50    Average  packs/day: 0.5 packs/day for 35.0 years (17.5 ttl pk-yrs)    Types: Cigarettes   Smokeless tobacco: Never   Tobacco comments:    [name] states trying to cut out smoking. He states he already has information on quitting.  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     Right handed   Caffeine: 1 cup coffee/day   Social Drivers of Corporate investment banker Strain: Not on file  Food Insecurity: Not on file  Transportation Needs: Not on file  Physical Activity: Not on file  Stress: Not on file  Social Connections: Not on file    His Allergies Are:  No Known Allergies:  His Current Medications Are:  Outpatient Encounter Medications as of 08/05/2023  Medication Sig   acetaminophen  (TYLENOL ) 325 MG tablet Take 650 mg by mouth every 6 (six) hours as needed.   carbidopa -levodopa  (SINEMET  IR) 25-100 MG tablet Take 1.5 tablets by mouth 4 (four) times daily.   rOPINIRole  (REQUIP ) 2 MG tablet Take 1 tablet (2 mg total) by mouth 3 (three) times daily.   rosuvastatin  (CRESTOR ) 40 MG tablet Take 1 tablet (40 mg total) by mouth daily.   No facility-administered encounter medications on file as of 08/05/2023.  :  Review of Systems:  Out of a complete 14 point review of systems, all are reviewed and negative with Wesley exception of these symptoms as listed below:  Review of Systems  Neurological:        [name] is here with his wife for consultation for cervical radiculopathy. He states he reported pain in his R shoulder to his primary care. He states when Wesley pain comes it radiates to his neck. He has a separate regular follow-up here in a couple of weeks but he feels he is doing  worse and this is Wesley least of his problems. He states his right hand is shaking. He is having changes in his bowel movements. His mouth quivers. He finds himself shuffling. He has not had any falls but he doesn't feel his balance is right. To him it feels like his feet aren't keeping up with what he is wanting to do. His wife feels he forgets a lot. He also feels like he is getting restless legs. He mentions some tingling in his 4th finger on L hand. He has daily pain in Wesley left 3rd and 4th fingers with issues releasing items.    Objective:  Neurological Exam  Physical Exam Physical Examination:   Vitals:   08/05/23 1255  BP: 138/80  Pulse: 60    General Examination: Wesley [name] is a very pleasant 64 y.o. male in no acute distress. He appears well-developed and well-nourished and well groomed.   HEENT: Normocephalic, atraumatic, pupils are equal, round and reactive to light, tracking is well preserved, no nystagmus is seen. He has mild facial masking, moderate nuchal rigidity, no significant hypophonia or dysarthria.  Slightly raspy voice.  He has an intermittent lower jaw tremor, no significant voice tremor.  He has mild to mouth dryness, mild airway crowding, tongue protrudes centrally and palate elevates symmetrically.  Hearing is grossly intact.   Chest: Clear to auscultation without wheezing, rhonchi or crackles noted.   Heart: S1+S2+0, regular and normal without murmurs, rubs or gallops noted.    Abdomen: Soft, non-tender and non-distended.     Extremities: There is no pitting edema in Wesley distal lower extremities bilaterally.    Skin: Warm and dry without trophic changes noted.   Musculoskeletal: exam reveals no obvious joint deformities, stable stiffness in his left hand.    Neurologically:  Mental status: Wesley [name] is awake, alert and oriented in all 4 spheres. His immediate and remote memory, attention, language skills and fund of knowledge are appropriate. There is no  evidence of aphasia, agnosia, apraxia or anomia. Speech is clear with normal prosody and enunciation. Thought process is linear. Mood is normal and affect is normal.     (On 08/04/19: On Archimedes spiral drawing he has insecurity with his nondominant hand but no obvious trembling with either hand. Handwriting is legible, not particularly micrographic.)   Overall mild to moderate bradykinesia.  No dyskinesias noted.  No significant  action tremor.     Cranial nerves II - XII are as described above under HEENT exam. In addition: right shoulder is slightly higher than left. Motor exam: Normal bulk, and strength is noted, tone is mildly increased in Wesley left upper extremity, particularly in Wesley wrist, with cogwheeling noted in Wesley wrist and elbow on Wesley left and slight increase in tone noted in Wesley right upper extremity without obvious cogwheeling, stable findings.  He has a mild to moderate resting tremor in Wesley left upper extremity, mild and intermittent resting tremor in Wesley right upper extremity.  No lower extremity tremor. Fine motor skills are moderately impaired on Wesley left, better on Wesley right.   He stands without difficulty, posture is mildly stooped for age, he walks with fairly good stride length and pace but decreased arm swing on Wesley left more than right. Reflexes are 1+ throughout. Cerebellar testing: No dysmetria or intention tremor. There is no truncal or gait ataxia.  Sensory exam: intact to light touch.    Assessment and Plan:    In summary, Wesley [name]. is a 62 year old male with an underlying medical history of hyperlipidemia, reflux disease, left hand pain and obesity, who presents for evaluation of his right-sided neck pain with radiation to Wesley shoulder, history and examination are concerning for radiculopathy.  He was treated with an oral steroid course back in mid February 2025 and feels that he may have improved a little bit.  Nevertheless, I recommend additional testing in  Wesley form of a cervical spine MRI which I will order and EMG and nerve conduction velocity testing through our office which we will schedule.  I explained Wesley test procedure to him and also Wesley MRI need to look for any structural changes.  If need be, we can consult with a spine specialist.  He has seen orthopedics for his right shoulder pain before.  He is advised to keep his appointment for his Parkinson's follow-up next week with Wesley Fick, NP, we can consider increasing his levodopa  to 2 pills 4 times daily at Wesley time.  We also talked about Wesley importance of maintaining a healthy lifestyle, good nutrition, good hydration, daily exercise.  Of note, he does walk over 3 miles daily.  He works in Research officer, political party.  His wife has been worried about his forgetfulness.  We may be able to do an MMSE at Wesley next appointment if time permits.  For his constipation, he is advised to take MiraLAX daily.  If this is not helpful, he is encouraged to talk to his PCP about a referral to GI.  He has been bothered by hemorrhoids as well.   Parkinson's symptoms date back to early 2021. His DaTscan  in June 2021 was supportive for an underlying parkinsonian syndrome.  He has been on ropinirole  since July 2021. This was increased to 2 mg 3 times daily in October 2021.  He had a second opinion with Dr. Winferd Hatter at [facility] neurology in January 2022.  He was noted to have weight increase and increased snacking, concern for compulsive eating.  He has had weight gain, which could be related to his ropinirole .  I would be very cautious in increasing this. We have previously discussed on several occasions Wesley importance of of pursuing a healthy lifestyle.   He was previously discouraged from drinking alcohol daily.  Alcohol is a notoriously disrupter and could affect his balance adversely and does not mix well with his levodopa  and ropinirole .  He quit  smoking last year.   We increased his levodopa  to 1 pill 4 times a day in January 2023.  We  increased this to 1.5 pills 4 times a day in December 2024 and continued with ropinirole  2 mg strength 1 pill 3 times a day.  Home sleep testing on 04/05/2023 did not show any significant obstructive sleep apnea.  We will keep him posted as to his cervical spine MRI results and EMG results by phone call for now.  I answered all their questions today and Wesley [name] and his wife were in agreement. I spent 40 minutes in total face-to-face time and in reviewing records during pre-charting, more than 50% of which was spent in counseling and coordination of care, reviewing test results, reviewing medications and treatment regimen and/or in discussing or reviewing Wesley diagnosis of cervical radiculopathy, Parkinson's disease, Wesley prognosis and treatment options. Pertinent laboratory and imaging test results that were available during this visit with Wesley [name] were reviewed by me and considered in my medical decision making (see chart for details).

## 2023-08-13 ENCOUNTER — Telehealth: Payer: Self-pay | Admitting: Family Medicine

## 2023-08-13 NOTE — Progress Notes (Signed)
 Chief Complaint  Patient presents with   Tremors    Rm 8 with spouse Pt is well, reports he is having worsening tremors, his mouth is starting to tremor. He has noticed shuffling in gait. He also mentions he his having some cognitive concerns. He is having a hard to retaining information recently told to him.    HISTORY OF PRESENT ILLNESS:  08/14/23 ALL:  Wesley [name]. is a 63 y.o. male here today for follow up for PD. He was seen last week by Dr Omar Bibber for neck pain. He reported worsening tremor and difficulty with balance. Also noted RLS symptoms were more bothersome. MRI and EMG ordered and he was advised to return, today, to discuss increasing carb-levo dose.   Today, he reports continued difficulty with worsening tremor, imbalance and restless legs. He continues carb-levo 1 tablet QID (7a, 11a, 4p, 11p) and ropinirole  2mg  TID. Carb-levo prescribed as 1.5 tablet QID. He is tolerating meds well. Wife reports new mouth tremor. She has noted mostly when he is driving. He also seems to have more shuffling when walking. Fortunately no falls. No assistive devices. He walks 3-4 miles daily for exercise.   He continues to have chronic constipation. Usually has bowel movement every 2-3 days. He recently saw PCP for hemorrhoid. He has added fiber to coffee. He usually drinks 3-4 bottles of water daily. He takes Miralax as needed.   He notes more difficulty with short term memory loss. He has a hard time retaining new information. 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 He is sleeping fairly well. He feels RLS is usually well managed but there are some nights where he feels he is more restless. HST 03/2023 did not show concerns for sleep breathing disorder. He drinks 1-2 glasses of wine about 3-4 times a week.   He is able to perform ADLs independently. He manages his own medicaitons and  finances. He drives without difficulty with exception of night time. He has a hard time seeing at night.      HISTORY (copied from Dr Dail Drought previous note)  Wesley [name] is a 63 year old right-handed gentleman with an underlying medical history of hyperlipidemia, Parkinson's disease, reflux disease, left hand pain and obesity, who presents for a new problem visit of neck pain.  He is referred by his primary care physician, Dr. Avanell Bob.  He is accompanied by his wife today.  He was last seen in this clinic for his Parkinson's disease in December 2020 for, at which time He saw Dr. Avanell Bob on 04/20/2023 and reported pain in his neck and right arm as well as shoulder pain.  He was treated with a prednisone taper.  He has not had any cervical imaging test done.  He is scheduled to see Terrilyn Fick, NP next week for his PD follow-up.   Today, 08/05/2023: He reports a several month history of right-sided neck pain.  It radiates to the shoulder.  Several months ago, he saw orthopedics for his right shoulder and had an x-ray and reports that there was no significant arthritis in the shoulder but he has not had any neck imaging.  He feels that the pain is probably a little bit better since completing the steroid treatment.  He took a 2-week course back in mid February.  His wife provides additional history.  She is worried about his balance.  He has had worsening tremor.  He continues to take Sinemet  1-1/2 pills 4 times a day and takes ropinirole .  He has some additional restless leg symptoms.  He feels that his balance is worse and his tremor is worse per his report as well.  He has had worsening constipation, takes MiraLAX as needed, not daily.  Had an issue with hemorrhoid a few weeks back.  He has not seen GI.  Pain in the neck does not radiate to the arm or hand, he does not feel any weakness in the right hand but has noticed a tremor on the right side as well.   REVIEW OF SYSTEMS: Out of a complete 14 system review of  symptoms, the patient complains only of the following symptoms, see HPI and all other reviewed systems are negative.   ALLERGIES: No Known Allergies   HOME MEDICATIONS: Outpatient Medications Prior to Visit  Medication Sig Dispense Refill   acetaminophen  (TYLENOL ) 325 MG tablet Take 650 mg by mouth every 6 (six) hours as needed.     carbidopa -levodopa  (SINEMET  IR) 25-100 MG tablet Take 1.5 tablets by mouth 4 (four) times daily. 540 tablet 3   rOPINIRole  (REQUIP ) 2 MG tablet Take 1 tablet (2 mg total) by mouth 3 (three) times daily. 270 tablet 3   rosuvastatin  (CRESTOR ) 40 MG tablet Take 1 tablet (40 mg total) by mouth daily. 90 tablet 3   No facility-administered medications prior to visit.     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 Cornett, MD;  Location: [facility];  Service: General;  Laterality: N/A;   EXAMINATION UNDER ANESTHESIA N/A 09/30/2012   Procedure: EXAM UNDER ANESTHESIA;  Surgeon: Joyce Nixon, MD;  Location: [facility] [place];  Service: General;  Laterality: N/A;   HIGH RESOLUTION ANOSCOPY N/A 09/30/2012   Procedure: HIGH RESOLUTION ANOSCOPY;  Surgeon: Joyce Nixon, MD;  Location: [facility] [place];  Service: General;  Laterality: N/A;   INGUINAL HERNIA REPAIR  1980'S   LASER ABLATION CONDOLAMATA N/A 09/30/2012   Procedure: LASER ABLATION CONDOLAMATA;  Surgeon: Joyce Nixon, MD;  Location: [facility] [place];  Service: General;  Laterality: N/A;   LASER ABLATION CONDOLAMATA N/A 02/02/2020   Procedure: LASER ABLATION CONDYLOMA;  Surgeon: Joyce Nixon, MD;   Location: [facility] [place];  Service: General;  Laterality: N/A;   RECTAL EXAM UNDER ANESTHESIA N/A 02/02/2020   Procedure: RECTAL EXAM UNDER ANESTHESIA;  Surgeon: Joyce Nixon, MD;  Location: [facility];  Service: General;  Laterality: N/A;   WART FULGURATION  07/16/2011   Procedure: FULGURATION ANAL WART;  Surgeon: Brandy Cal. Cornett, MD;  Location: [facility];  Service: General;  Laterality: N/A;     FAMILY HISTORY: Family History  Problem Relation Age of Onset   Alzheimer's disease Mother    Heart disease Father        CABG late 46s.   Parkinson's disease Neg Hx      SOCIAL HISTORY: Social History   Socioeconomic History   Marital status: Married    Spouse name: Not on file   Number of children:  Not on file   Years of education: Not on file   Highest education level: Not on file  Occupational History   Not on file  Tobacco Use   Smoking status: Every Day    Current packs/day: 0.50    Average packs/day: 0.5 packs/day for 35.0 years (17.5 ttl pk-yrs)    Types: Cigarettes   Smokeless tobacco: Never   Tobacco comments:    Patient states trying to cut out smoking. He states he already has information on quitting.  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     Right handed   Caffeine: 1 cup coffee/day   Social Drivers of Corporate investment banker Strain: Not on file  Food Insecurity: Not on file  Transportation Needs: Not on file  Physical Activity: Not on file  Stress: Not on file  Social Connections: Not on file  Intimate Partner Violence: Not on file     PHYSICAL EXAM  Vitals:   08/14/23 0843  BP:  119/76  Pulse: 89  Weight: 223 lb (101.2 kg)  Height: 5' 8 (1.727 m)   Body mass index is 33.91 kg/m.  Generalized: Well developed, in no acute distress  Cardiology: normal rate and rhythm, no murmur auscultated  Respiratory: clear to auscultation bilaterally    Neurological examination  Mentation: Alert oriented to time, place, history taking. Follows all commands speech and language fluent Cranial nerve II-XII: Pupils were equal round reactive to light. Extraocular movements were full, visual field were full on confrontational test. Facial sensation and strength were normal. Uvula tongue midline. Head turning and shoulder shrug  were normal and symmetric. Motor: The motor testing reveals 5 over 5 strength of all 4 extremities. Good symmetric motor tone is noted throughout. No tremor noted with visit, today.  Sensory: Sensory testing is intact to soft touch on all 4 extremities. No evidence of extinction is noted.  Coordination: Cerebellar testing reveals good finger-nose-finger and heel-to-shin bilaterally.  Gait and station: Gait is normal.  Reflexes: Deep tendon reflexes are symmetric and normal bilaterally.    DIAGNOSTIC DATA (LABS, IMAGING, TESTING) - I reviewed patient records, labs, notes, testing and imaging myself where available.  Lab Results  Component Value Date   WBC 16.8 (H) 11/27/2015   HGB 15.3 11/27/2015   HCT 45.0 11/27/2015   MCV 92.1 11/27/2015   PLT 321 11/27/2015      Component Value Date/Time   NA 139 11/27/2015 2049   K 3.6 11/27/2015 2049   CL 108 11/27/2015 2049   CO2 24 07/15/2011 0950   GLUCOSE 106 (H) 11/27/2015 2049   BUN 15 11/27/2015 2049   CREATININE 1.00 11/27/2015 2049   CALCIUM  9.3 07/15/2011 0950   PROT 7.3 07/15/2011 0950   ALBUMIN 4.0 07/15/2011 0950   AST 30 07/15/2011 0950   ALT 36 07/15/2011 0950   ALKPHOS 97 07/15/2011 0950   BILITOT 0.8 07/15/2011 0950   GFRNONAA >90 07/15/2011 0950   GFRAA >90 07/15/2011 0950   No  results found for: CHOL, HDL, LDLCALC, LDLDIRECT, TRIG, CHOLHDL No results found for: NWGN5A No  results found for: VITAMINB12      08/14/2023    8:45 AM  MMSE - Mini Mental State Exam  Orientation to time 5  Orientation to Place 5  Registration 3  Attention/ Calculation 4  Recall 3  Language- name 2 objects 2  Language- repeat 1  Language- follow 3 step command 3  Language- read & follow direction 1  Write a sentence 1  Copy design 1  Total score 29         No data to display           ASSESSMENT AND PLAN  63 y.o. 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 here with    Parkinson's disease without dyskinesia, with fluctuating manifestations (HCC) - Plan: Ambulatory referral to Neurology  Cervical radiculopathy  Chronic constipation  Memory loss  Restless leg  Wesley [name]. Notes worsening hand tremor, new mouth tremor, intermittent difficulty with RLS and short term memory loss/difficulty with retention. He has only taken carb-levo 1 tablet four times daily. Prescribed as 1.5 tablets QID. I have advised adding 1/2 tablet every week until taking 1.5 tablets QID. Continue ropinirole  2mg  daily at bedtime. MMSE 29/30. 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 Formal neurocognitive testing referral offered but declined for now. He does wish to be referred to [facility] for second opinion and consideration of potential research trial participation. Referral placed. We discussed ned for increased water intake and alcohol us  in moderation. Consider adding Colace to Miralax for chronic constipation. Continue regular exercise. Discussed memory compensation strategies and benefits of MIND diet. He will follow up with PCP as directed. He will return to see me in 4 months. He verbalizes understanding and  agreement with this plan.   Orders Placed This Encounter  Procedures   Ambulatory referral to Neurology    Referral Priority:   Routine    Referral Type:   Consultation    Referral Reason:   Specialty Services Required    Requested Specialty:   Neurology    Number of Visits Requested:   1     No orders of the defined types were placed in this encounter.    Terrilyn Fick, MSN, FNP-C 08/14/2023, 9:45 AM  [facility] [address], [address] [place], [place] [address] [phone]

## 2023-08-13 NOTE — Telephone Encounter (Signed)
 error

## 2023-08-13 NOTE — Patient Instructions (Addendum)
 Below is our plan:  We will increase carbidopa -levodopa  to prescribed dose of 1.5 tablets four times daily. I recommend increasing by 1/2 tablet each week until you reach prescribed dose. Continue ropinirole  2mg  TID. Please increase intake. Consider Colace. Avoid driving at night if you are having difficulty seeing. Continue regular follow up with your ophthalmologist.   Please make sure you are staying well hydrated. I recommend 50-60 ounces daily. Well balanced diet and regular exercise encouraged. Consistent sleep schedule with 6-8 hours recommended.   Please continue follow up with care team as directed.   Follow up with Dr Omar Bibber in 4 months   You may receive a survey regarding today's visit. I encourage you to leave honest feed back as I do use this information to improve patient care. Thank you for seeing me today!   Management of Memory Problems   There are some general things you can do to help manage your memory problems.  Your memory may not in fact recover, but by using techniques and strategies you will be able to manage your memory difficulties better.   1)  Establish a routine. Try to establish and then stick to a regular routine.  By doing this, you will get used to what to expect and you will reduce the need to rely on your memory.  Also, try to do things at the same time of day, such as taking your medication or checking your calendar first thing in the morning. Think about think that you can do as a part of a regular routine and make a list.  Then enter them into a daily planner to remind you.  This will help you establish a routine.   2)  Organize your environment. Organize your environment so that it is uncluttered.  Decrease visual stimulation.  Place everyday items such as keys or cell phone in the same place every day (ie.  Basket next to front door) Use post it notes with a brief message to yourself (ie. Turn off light, lock the door) Use labels to indicate where things  go (ie. Which cupboards are for food, dishes, etc.) Keep a notepad and pen by the telephone to take messages   3)  Memory Aids A diary or journal/notebook/daily planner Making a list (shopping list, chore list, to do list that needs to be done) Using an alarm as a reminder (kitchen timer or cell phone alarm) Using cell phone to store information (Notes, Calendar, Reminders) Calendar/White board placed in a prominent position Post-it notes   In order for memory aids to be useful, you need to have good habits.  It's no good remembering to make a note in your journal if you don't remember to look in it.  Try setting aside a certain time of day to look in journal.   4)  Improving mood and managing fatigue. There may be other factors that contribute to memory difficulties.  Factors, such as anxiety, depression and tiredness can affect memory. Regular gentle exercise can help improve your mood and give you more energy. Exercise: there are short videos created by the [employer] on Health specially for older adults: https://bit.ly/2I30q97.  Mediterranean diet: which emphasizes fruits, vegetables, whole grains, legumes, fish, and other seafood; unsaturated fats such as olive oils; and low amounts of red meat, eggs, and sweets. A variation of this, called MIND (Mediterranean-DASH Intervention for Neurodegenerative Delay) incorporates the DASH (Dietary Approaches to Stop Hypertension) diet, which has been shown to lower high blood pressure, a  risk factor for Alzheimer's disease. More information at: ExitMarketing.de.  Aerobic exercise that improve heart health is also good for the mind.  [employer] on Aging have short videos for exercises that you can do at home: BlindWorkshop.com.pt Simple relaxation techniques may help relieve symptoms of anxiety Try to get back to completing activities or hobbies you enjoyed  doing in the past. Learn to pace yourself through activities to decrease fatigue. Find out about some local support groups where you can share experiences with others. Try and achieve 7-8 hours of sleep at night.   Tasks to improve attention/working memory 1. Good sleep hygiene (7-8 hrs of sleep) 2. Learning a new skill (Painting, Carpentry, Pottery, new language, Knitting). 3.Cognitive exercises (keep a daily journal, Puzzles) 4. Physical exercise and training  (30 min/day X 4 days week) 5. Being on Antidepressant if needed 6.Yoga, Meditation, Tai Chi 7. Decrease alcohol intake 8.Have a clear schedule and structure in daily routine   MIND Diet: The Mediterranean-DASH Diet Intervention for Neurodegenerative Delay, or MIND diet, targets the health of the aging brain. Research participants with the highest MIND diet scores had a significantly slower rate of cognitive decline compared with those with the lowest scores. The effects of the MIND diet on cognition showed greater effects than either the Mediterranean or the DASH diet alone.   The healthy items the MIND diet guidelines suggest include:   3+ servings a day of whole grains 1+ servings a day of vegetables (other than green leafy) 6+ servings a week of green leafy vegetables 5+ servings a week of nuts 4+ meals a week of beans 2+ servings a week of berries 2+ meals a week of poultry 1+ meals a week of fish Mainly olive oil if added fat is used   The unhealthy items, which are higher in saturated and trans fat, include: Less than 5 servings a week of pastries and sweets Less than 4 servings a week of red meat (including beef, pork, lamb, and products made from these meats) Less than one serving a week of cheese and fried foods Less than 1 tablespoon a day of butter/stick margarine

## 2023-08-14 ENCOUNTER — Encounter: Payer: Self-pay | Admitting: Family Medicine

## 2023-08-14 ENCOUNTER — Ambulatory Visit (INDEPENDENT_AMBULATORY_CARE_PROVIDER_SITE_OTHER): Admitting: Family Medicine

## 2023-08-14 VITALS — BP 119/76 | HR 89 | Ht 68.0 in | Wt 223.0 lb

## 2023-08-14 DIAGNOSIS — K5909 Other constipation: Secondary | ICD-10-CM

## 2023-08-14 DIAGNOSIS — G20A2 Parkinson's disease without dyskinesia, with fluctuations: Secondary | ICD-10-CM | POA: Diagnosis not present

## 2023-08-14 DIAGNOSIS — G2581 Restless legs syndrome: Secondary | ICD-10-CM

## 2023-08-14 DIAGNOSIS — M5412 Radiculopathy, cervical region: Secondary | ICD-10-CM | POA: Diagnosis not present

## 2023-08-14 DIAGNOSIS — R413 Other amnesia: Secondary | ICD-10-CM | POA: Diagnosis not present

## 2023-08-17 ENCOUNTER — Telehealth: Payer: Self-pay | Admitting: Family Medicine

## 2023-08-17 NOTE — Telephone Encounter (Signed)
 Referral to Neurology  faxed to [facility]   [facility]  Phone#[phone] Faxed# [phone]

## 2023-08-21 ENCOUNTER — Ambulatory Visit
Admission: RE | Admit: 2023-08-21 | Discharge: 2023-08-21 | Disposition: A | Discharge status: Home / Self Care | Source: Ambulatory Visit | Attending: Acute Care | Admitting: Acute Care

## 2023-08-21 DIAGNOSIS — Z87891 Personal history of nicotine dependence: Secondary | ICD-10-CM

## 2023-08-21 DIAGNOSIS — Z122 Encounter for screening for malignant neoplasm of respiratory organs: Secondary | ICD-10-CM | POA: Diagnosis not present

## 2023-08-21 DIAGNOSIS — F17210 Nicotine dependence, cigarettes, uncomplicated: Secondary | ICD-10-CM

## 2023-08-31 ENCOUNTER — Encounter: Payer: Self-pay | Admitting: Neurology

## 2023-09-03 ENCOUNTER — Ambulatory Visit: Payer: BC Managed Care – PPO | Admitting: Family Medicine

## 2023-09-03 ENCOUNTER — Other Ambulatory Visit: Payer: Self-pay

## 2023-09-03 DIAGNOSIS — Z122 Encounter for screening for malignant neoplasm of respiratory organs: Secondary | ICD-10-CM

## 2023-09-03 DIAGNOSIS — Z87891 Personal history of nicotine dependence: Secondary | ICD-10-CM

## 2023-09-03 DIAGNOSIS — F17210 Nicotine dependence, cigarettes, uncomplicated: Secondary | ICD-10-CM

## 2023-09-11 ENCOUNTER — Ambulatory Visit
Admission: RE | Admit: 2023-09-11 | Discharge: 2023-09-11 | Disposition: A | Discharge status: Home / Self Care | Source: Ambulatory Visit | Attending: Neurology | Admitting: Neurology

## 2023-09-11 DIAGNOSIS — M5412 Radiculopathy, cervical region: Secondary | ICD-10-CM

## 2023-09-11 DIAGNOSIS — K5909 Other constipation: Secondary | ICD-10-CM

## 2023-09-11 DIAGNOSIS — G20A2 Parkinson's disease without dyskinesia, with fluctuations: Secondary | ICD-10-CM

## 2023-09-15 ENCOUNTER — Ambulatory Visit: Payer: Self-pay | Admitting: Cardiology

## 2023-09-15 DIAGNOSIS — E785 Hyperlipidemia, unspecified: Secondary | ICD-10-CM

## 2023-09-15 LAB — LIPOPROTEIN A (LPA): Lipoprotein (a): 28.3 nmol/L (ref <75.0)

## 2023-09-15 LAB — LIPID PANEL
Chol/HDL Ratio: 3.5 ratio (ref 0.0–5.0)
Cholesterol, Total: 159 mg/dL (ref 100–199)
HDL: 45 mg/dL (ref >39)
LDL Chol Calc (NIH): 80 mg/dL (ref 0–99)
Triglycerides: 204 mg/dL — ABNORMAL HIGH (ref 0–149)
VLDL Cholesterol Cal: 34 mg/dL (ref 5–40)

## 2023-09-21 MED ORDER — EZETIMIBE 10 MG PO TABS: 10.0000 mg | ORAL_TABLET | Freq: Every day | ORAL | 3 refills | Status: AC

## 2023-09-21 NOTE — Telephone Encounter (Signed)
-----   Message from Lynwood Schilling sent at 09/15/2023 11:54 AM EDT ----- I would like to add Zetia  10 mg PO daily.  He is not quite at target for his LDL.  Repeat a lipid profile in 3 months.   Call Mr. Cousins with the results and send results to Okey Carlin Redbird, MD ----- Message ----- From: Interface, [facility] Lab Results In Sent: 09/15/2023   3:35 AM EDT To: Lynwood Schilling, MD

## 2023-09-21 NOTE — Telephone Encounter (Signed)
 Spoke with pt regarding his results. Pt aware of addition of Zetia  and lab draw in 3 months. Pt verbalized understanding. All questions if any were answered. Results sent to PCP

## 2023-09-22 ENCOUNTER — Telehealth: Payer: Self-pay | Admitting: Family Medicine

## 2023-09-22 NOTE — Telephone Encounter (Addendum)
 Pt asking to discuss with RN if as a result of him missing the MRI is he ok to still do the NCV/EMG on tomorrow, the message from RN: recommended he complete both for evaluation per Dr. Obie notes. Was relayed.  Pt said he didn't do the MRI because he was expected to pay $1700.00.  Pt also decided to hold off on the NCV that was scheduled for tomorrow.

## 2023-09-22 NOTE — Telephone Encounter (Signed)
 [name]- wanted to send for update

## 2023-09-23 ENCOUNTER — Encounter: Admitting: Neurology

## 2023-09-24 NOTE — Telephone Encounter (Signed)
 Patient called in, stated he called [facility] and they have no referral for him on file. Ariana refaxed for patient

## 2023-10-01 IMAGING — CT CT CHEST LUNG CANCER SCREENING LOW DOSE W/O CM
1 of 2 series · 15 of 33 positions shown, 19 images · non-contrast
Comparison: Lung cancer screening CT dated November 18, 2017

CLINICAL DATA: Current smoker with 38 pack-year history



[Series 6: super d · axial · 0.85mm/px · z∈[-382,-71]mm · 15 of 427 slices shown, 19 images]
[im 19/427  mediastinal]
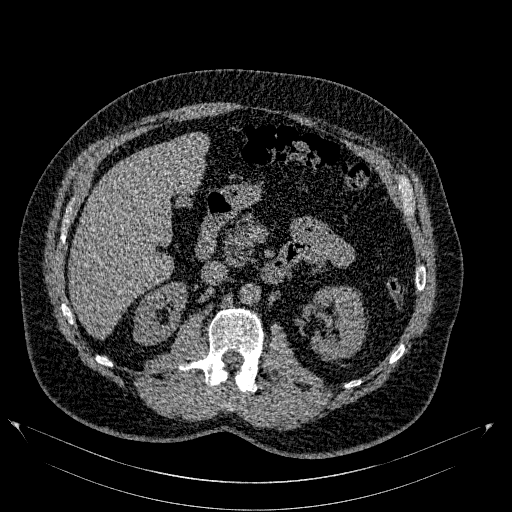
[im 19/427  lung]
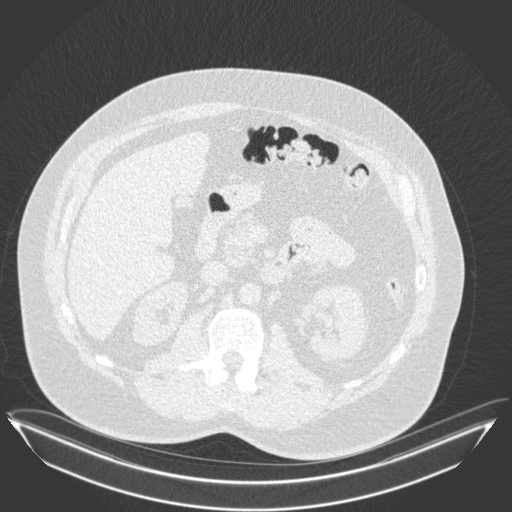
[im 56/427  lung]
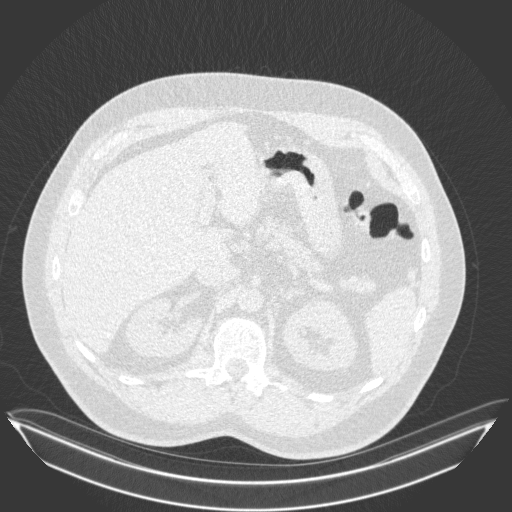
[im 93/427  lung]
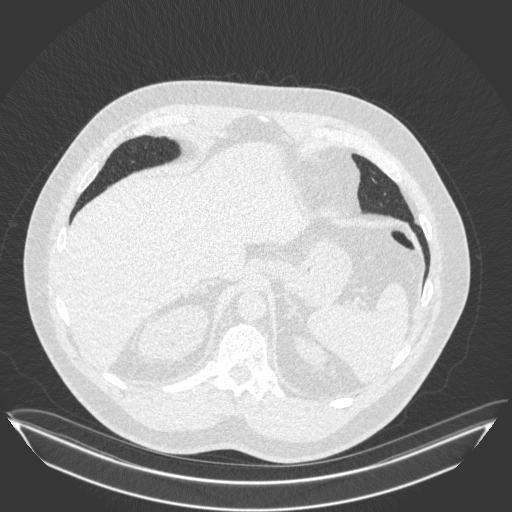
[im 112/427  lung]
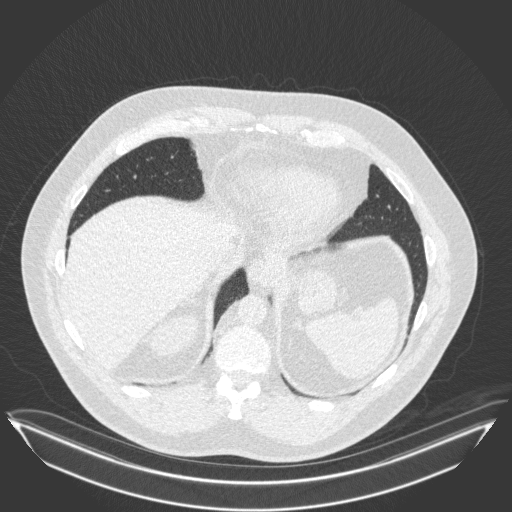
[im 130/427  mediastinal]
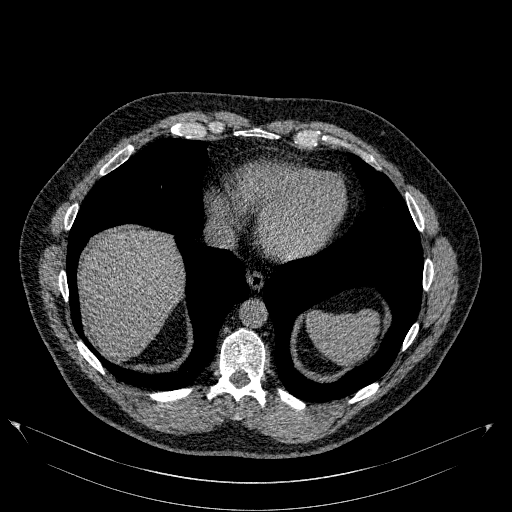
[im 130/427  lung]
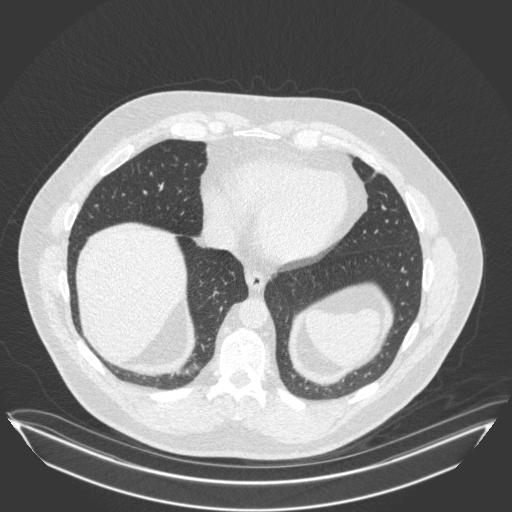
[im 167/427  lung]
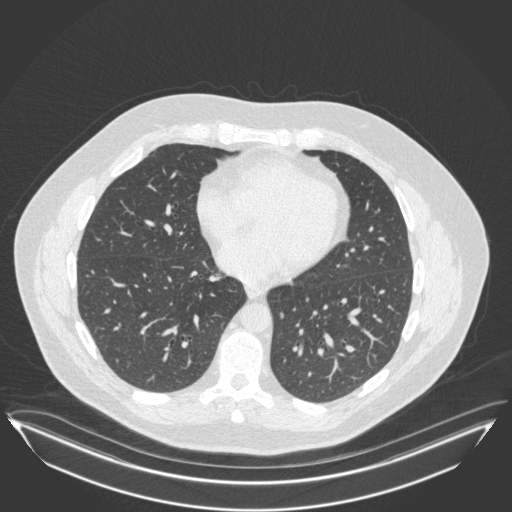
[im 199/427  lung]
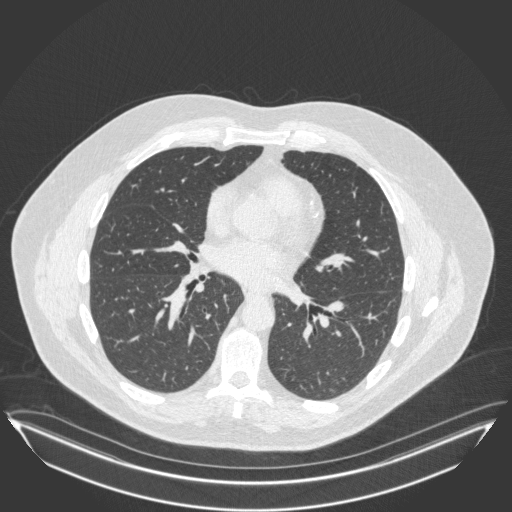
[im 214/427  lung]
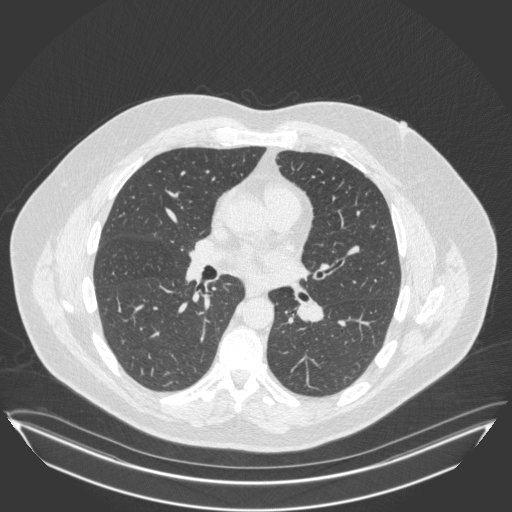
[im 223/427  mediastinal]
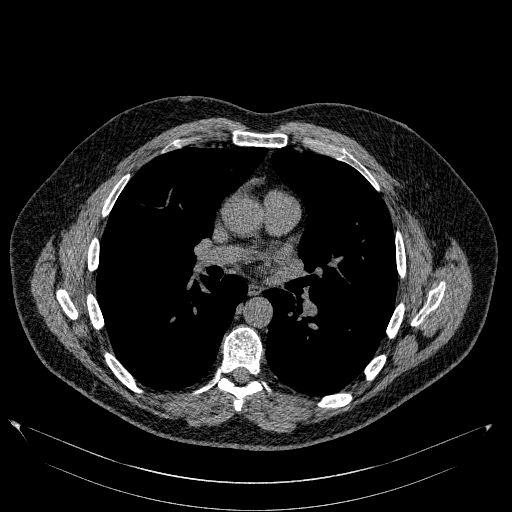
[im 223/427  lung]
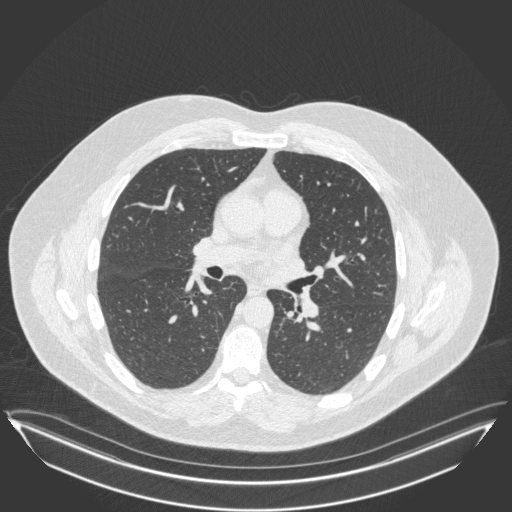
[im 260/427  lung]
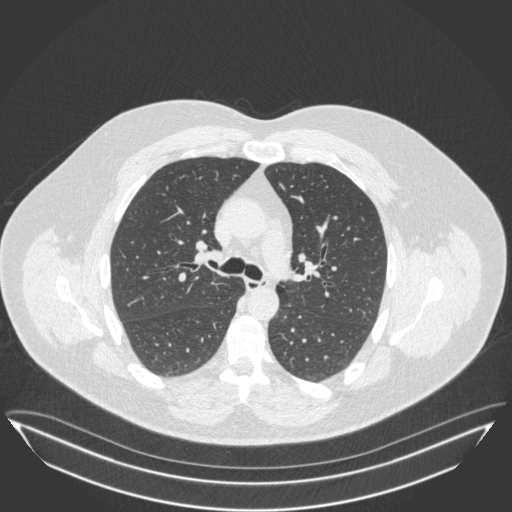
[im 297/427  lung]
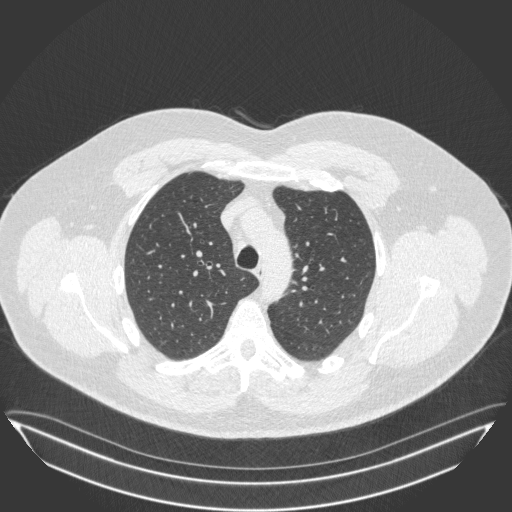
[im 315/427  lung]
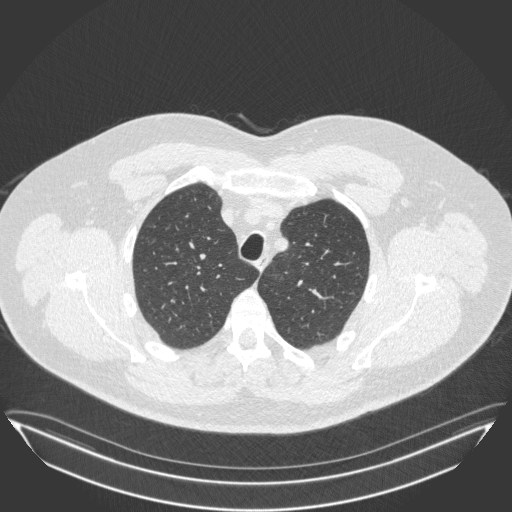
[im 334/427  mediastinal]
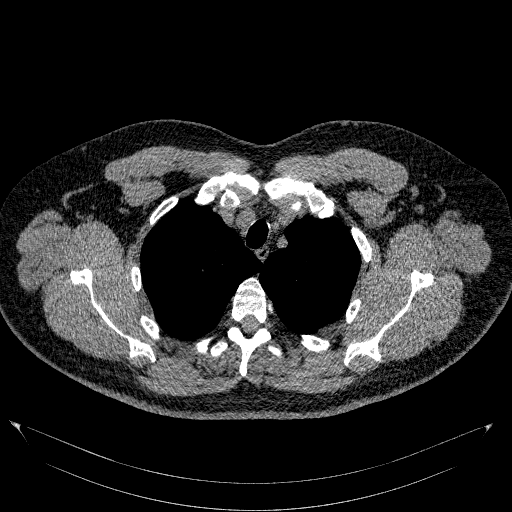
[im 334/427  lung]
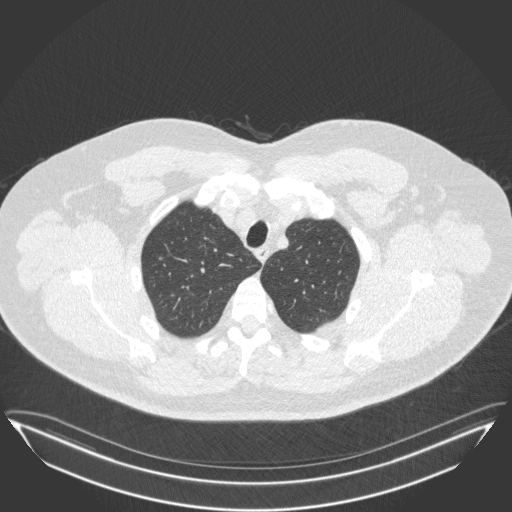
[im 371/427  lung]
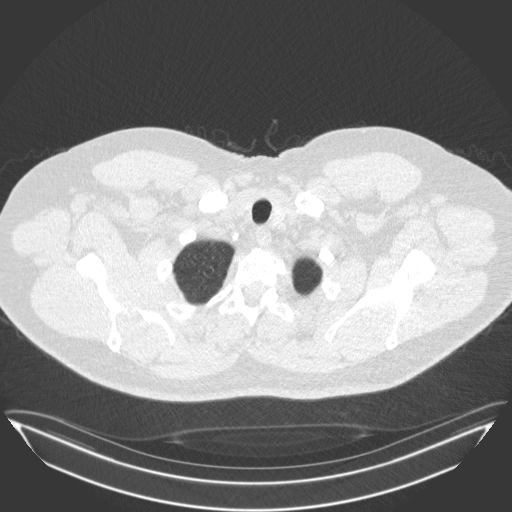
[im 408/427  lung]
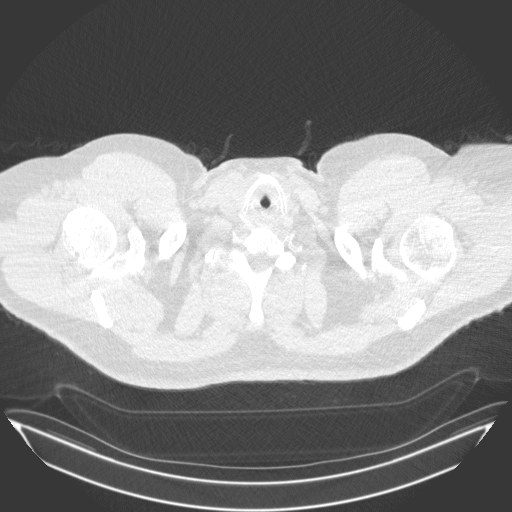

[15 of 33 positions shown; findings below may reference images not displayed]

FINDINGS: Cardiovascular: Normal heart size. No pericardial effusion. Mild
coronary artery calcifications. Atherosclerotic disease of the
thoracic aorta.

Mediastinum/Nodes: Esophagus and thyroid are unremarkable. No
pathologically enlarged lymph nodes seen in the chest.

Lungs/Pleura: Central airways are patent. Mild centrilobular
emphysema. No consolidation, pleural effusion or pneumothorax.
Stable solid pulmonary nodule the right upper lobe measuring 3.1 mm
on image 75.

Upper Abdomen: No acute abnormality.

Musculoskeletal: No chest wall mass or suspicious bone lesions
identified.
IMPRESSION: 1. Lung-RADS 2, benign appearance or behavior. Continue annual
screening with low-dose chest CT without contrast in 12 months.
2. Aortic Atherosclerosis (SA7VU-KJJ.J) and Emphysema ([id number]-[id number]).

## 2023-10-07 NOTE — Telephone Encounter (Signed)
 Patient called in, stated he still has not heard form [facility], I called them and spoke with Lamar, asked if they had received either referral we faxed over, he tried to reach someone in referrals, but was unable to get ahold of anyone to verify if they have gotten this. He stated they are very behind on entering referrals, to give it a few more days. Called pt back to let him know, I said if he hasn't heard from them by next week to call them, I gave him their number

## 2023-10-22 DIAGNOSIS — D2271 Melanocytic nevi of right lower limb, including hip: Secondary | ICD-10-CM | POA: Diagnosis not present

## 2023-10-22 DIAGNOSIS — D485 Neoplasm of uncertain behavior of skin: Secondary | ICD-10-CM | POA: Diagnosis not present

## 2023-10-22 DIAGNOSIS — L578 Other skin changes due to chronic exposure to nonionizing radiation: Secondary | ICD-10-CM | POA: Diagnosis not present

## 2023-10-22 DIAGNOSIS — C44729 Squamous cell carcinoma of skin of left lower limb, including hip: Secondary | ICD-10-CM | POA: Diagnosis not present

## 2023-10-22 DIAGNOSIS — D225 Melanocytic nevi of trunk: Secondary | ICD-10-CM | POA: Diagnosis not present

## 2023-10-22 DIAGNOSIS — L821 Other seborrheic keratosis: Secondary | ICD-10-CM | POA: Diagnosis not present

## 2023-10-22 DIAGNOSIS — L814 Other melanin hyperpigmentation: Secondary | ICD-10-CM | POA: Diagnosis not present

## 2023-10-28 DIAGNOSIS — Z6834 Body mass index (BMI) 34.0-34.9, adult: Secondary | ICD-10-CM | POA: Diagnosis not present

## 2023-10-28 DIAGNOSIS — G20A1 Parkinson's disease without dyskinesia, without mention of fluctuations: Secondary | ICD-10-CM | POA: Diagnosis not present

## 2023-10-28 DIAGNOSIS — K4090 Unilateral inguinal hernia, without obstruction or gangrene, not specified as recurrent: Secondary | ICD-10-CM | POA: Diagnosis not present

## 2023-11-04 DIAGNOSIS — C44729 Squamous cell carcinoma of skin of left lower limb, including hip: Secondary | ICD-10-CM | POA: Diagnosis not present

## 2023-11-09 DIAGNOSIS — K429 Umbilical hernia without obstruction or gangrene: Secondary | ICD-10-CM | POA: Diagnosis not present

## 2023-11-09 DIAGNOSIS — K4091 Unilateral inguinal hernia, without obstruction or gangrene, recurrent: Secondary | ICD-10-CM | POA: Diagnosis not present

## 2023-11-23 ENCOUNTER — Ambulatory Visit: Payer: Self-pay | Admitting: Surgery

## 2023-11-30 ENCOUNTER — Encounter (HOSPITAL_BASED_OUTPATIENT_CLINIC_OR_DEPARTMENT_OTHER): Payer: Self-pay | Admitting: Surgery

## 2023-11-30 ENCOUNTER — Other Ambulatory Visit: Payer: Self-pay

## 2023-12-07 NOTE — Progress Notes (Signed)
 Provided patient with presurgical soap, Ensure, and instructions. Patient verbalized understanding.

## 2023-12-08 ENCOUNTER — Ambulatory Visit (HOSPITAL_BASED_OUTPATIENT_CLINIC_OR_DEPARTMENT_OTHER)
Admission: RE | Admit: 2023-12-08 | Discharge: 2023-12-08 | Disposition: A | Discharge status: Home / Self Care | Attending: Surgery | Admitting: Surgery

## 2023-12-08 ENCOUNTER — Other Ambulatory Visit: Payer: Self-pay

## 2023-12-08 ENCOUNTER — Encounter (HOSPITAL_BASED_OUTPATIENT_CLINIC_OR_DEPARTMENT_OTHER): Payer: Self-pay | Admitting: Anesthesiology

## 2023-12-08 ENCOUNTER — Encounter (HOSPITAL_BASED_OUTPATIENT_CLINIC_OR_DEPARTMENT_OTHER)
Admission: RE | Disposition: A | Discharge status: Home / Self Care | Payer: Self-pay | Source: Home / Self Care | Attending: Surgery

## 2023-12-08 ENCOUNTER — Encounter (HOSPITAL_BASED_OUTPATIENT_CLINIC_OR_DEPARTMENT_OTHER): Payer: Self-pay | Admitting: Surgery

## 2023-12-08 ENCOUNTER — Ambulatory Visit (HOSPITAL_BASED_OUTPATIENT_CLINIC_OR_DEPARTMENT_OTHER): Payer: Self-pay | Admitting: Anesthesiology

## 2023-12-08 DIAGNOSIS — D176 Benign lipomatous neoplasm of spermatic cord: Secondary | ICD-10-CM | POA: Insufficient documentation

## 2023-12-08 DIAGNOSIS — F17210 Nicotine dependence, cigarettes, uncomplicated: Secondary | ICD-10-CM | POA: Insufficient documentation

## 2023-12-08 DIAGNOSIS — F17200 Nicotine dependence, unspecified, uncomplicated: Secondary | ICD-10-CM | POA: Insufficient documentation

## 2023-12-08 DIAGNOSIS — K4091 Unilateral inguinal hernia, without obstruction or gangrene, recurrent: Secondary | ICD-10-CM | POA: Diagnosis not present

## 2023-12-08 DIAGNOSIS — G20A1 Parkinson's disease without dyskinesia, without mention of fluctuations: Secondary | ICD-10-CM | POA: Diagnosis not present

## 2023-12-08 DIAGNOSIS — Z79899 Other long term (current) drug therapy: Secondary | ICD-10-CM | POA: Diagnosis not present

## 2023-12-08 DIAGNOSIS — K429 Umbilical hernia without obstruction or gangrene: Secondary | ICD-10-CM

## 2023-12-08 DIAGNOSIS — E782 Mixed hyperlipidemia: Secondary | ICD-10-CM

## 2023-12-08 DIAGNOSIS — E785 Hyperlipidemia, unspecified: Secondary | ICD-10-CM | POA: Diagnosis not present

## 2023-12-08 DIAGNOSIS — I2510 Atherosclerotic heart disease of native coronary artery without angina pectoris: Secondary | ICD-10-CM | POA: Diagnosis not present

## 2023-12-08 HISTORY — PX: UMBILICAL HERNIA REPAIR: SHX196

## 2023-12-08 HISTORY — PX: INGUINAL HERNIA REPAIR: SHX194

## 2023-12-08 SURGERY — REPAIR, HERNIA, INGUINAL, LAPAROSCOPIC
Anesthesia: General | Site: Inguinal

## 2023-12-08 MED ORDER — EPHEDRINE 5 MG/ML INJ
INTRAVENOUS | Status: AC
  Filled 2023-12-08: qty 5

## 2023-12-08 MED ORDER — FENTANYL CITRATE (PF) 100 MCG/2ML IJ SOLN
INTRAMUSCULAR | Status: AC
  Filled 2023-12-08: qty 2

## 2023-12-08 MED ORDER — MIDAZOLAM HCL 2 MG/2ML IJ SOLN
INTRAMUSCULAR | Status: AC
  Filled 2023-12-08: qty 2

## 2023-12-08 MED ORDER — CHLORHEXIDINE GLUCONATE CLOTH 2 % EX PADS: 6.0000 | MEDICATED_PAD | Freq: Once | CUTANEOUS | Status: DC

## 2023-12-08 MED ORDER — VISTASEAL 10 ML SINGLE DOSE KIT
10.0000 mL | PACK | CUTANEOUS | Status: DC
  Filled 2023-12-08: qty 10

## 2023-12-08 MED ORDER — OXYCODONE HCL 5 MG PO TABS
5.0000 mg | ORAL_TABLET | Freq: Once | ORAL | Status: AC | PRN
  Administered 2023-12-08: 5 mg via ORAL

## 2023-12-08 MED ORDER — ROCURONIUM BROMIDE 10 MG/ML (PF) SYRINGE
PREFILLED_SYRINGE | INTRAVENOUS | Status: AC
  Filled 2023-12-08: qty 10

## 2023-12-08 MED ORDER — EPHEDRINE SULFATE-NACL 50-0.9 MG/10ML-% IV SOSY
PREFILLED_SYRINGE | INTRAVENOUS | Status: DC | PRN
  Administered 2023-12-08: 10 mg via INTRAVENOUS

## 2023-12-08 MED ORDER — PROPOFOL 10 MG/ML IV BOLUS
INTRAVENOUS | Status: DC | PRN
  Administered 2023-12-08: 200 ug via INTRAVENOUS

## 2023-12-08 MED ORDER — SUGAMMADEX SODIUM 200 MG/2ML IV SOLN
INTRAVENOUS | Status: DC | PRN
  Administered 2023-12-08 (×2): 200 mg via INTRAVENOUS

## 2023-12-08 MED ORDER — KETOROLAC TROMETHAMINE 30 MG/ML IJ SOLN
INTRAMUSCULAR | Status: DC | PRN
  Administered 2023-12-08: 30 mg via INTRAVENOUS

## 2023-12-08 MED ORDER — PHENYLEPHRINE 80 MCG/ML (10ML) SYRINGE FOR IV PUSH (FOR BLOOD PRESSURE SUPPORT)
PREFILLED_SYRINGE | INTRAVENOUS | Status: AC
  Filled 2023-12-08: qty 10

## 2023-12-08 MED ORDER — ACETAMINOPHEN 10 MG/ML IV SOLN
INTRAVENOUS | Status: AC
  Filled 2023-12-08: qty 100

## 2023-12-08 MED ORDER — LIDOCAINE 2% (20 MG/ML) 5 ML SYRINGE
INTRAMUSCULAR | Status: DC | PRN
  Administered 2023-12-08: 60 mg via INTRAVENOUS

## 2023-12-08 MED ORDER — MIDAZOLAM HCL 5 MG/5ML IJ SOLN
INTRAMUSCULAR | Status: DC | PRN
  Administered 2023-12-08: 2 mg via INTRAVENOUS

## 2023-12-08 MED ORDER — BUPIVACAINE-EPINEPHRINE 0.25% -1:200000 IJ SOLN
INTRAMUSCULAR | Status: DC | PRN
  Administered 2023-12-08: 10 mL

## 2023-12-08 MED ORDER — IBUPROFEN 800 MG PO TABS: 800.0000 mg | ORAL_TABLET | Freq: Three times a day (TID) | ORAL | 0 refills | Status: AC | PRN

## 2023-12-08 MED ORDER — TAMSULOSIN HCL 0.4 MG PO CAPS: 0.4000 mg | ORAL_CAPSULE | Freq: Every day | ORAL | 0 refills | Status: AC

## 2023-12-08 MED ORDER — PHENYLEPHRINE 80 MCG/ML (10ML) SYRINGE FOR IV PUSH (FOR BLOOD PRESSURE SUPPORT)
PREFILLED_SYRINGE | INTRAVENOUS | Status: DC | PRN
  Administered 2023-12-08: 80 ug via INTRAVENOUS
  Administered 2023-12-08: 40 ug via INTRAVENOUS

## 2023-12-08 MED ORDER — FENTANYL CITRATE (PF) 100 MCG/2ML IJ SOLN
25.0000 ug | INTRAMUSCULAR | Status: DC | PRN
  Administered 2023-12-08 (×2): 50 ug via INTRAVENOUS

## 2023-12-08 MED ORDER — ONDANSETRON HCL 4 MG/2ML IJ SOLN: 4.0000 mg | Freq: Once | INTRAMUSCULAR | Status: DC | PRN

## 2023-12-08 MED ORDER — TISSEEL 10 ML EX KIT: PACK | CUTANEOUS | Status: DC | PRN

## 2023-12-08 MED ORDER — 0.9 % SODIUM CHLORIDE (POUR BTL) OPTIME
TOPICAL | Status: DC | PRN
  Administered 2023-12-08: 1000 mL

## 2023-12-08 MED ORDER — KETOROLAC TROMETHAMINE 30 MG/ML IJ SOLN
INTRAMUSCULAR | Status: AC
  Filled 2023-12-08: qty 1

## 2023-12-08 MED ORDER — ACETAMINOPHEN 10 MG/ML IV SOLN
INTRAVENOUS | Status: DC | PRN
  Administered 2023-12-08: 1000 mg via INTRAVENOUS

## 2023-12-08 MED ORDER — DEXAMETHASONE SODIUM PHOSPHATE 10 MG/ML IJ SOLN
INTRAMUSCULAR | Status: AC
  Filled 2023-12-08: qty 1

## 2023-12-08 MED ORDER — CEFAZOLIN SODIUM-DEXTROSE 2-4 GM/100ML-% IV SOLN
2.0000 g | INTRAVENOUS | Status: AC
Start: 2023-12-08 — End: 2023-12-08
  Administered 2023-12-08: 2 g via INTRAVENOUS

## 2023-12-08 MED ORDER — ONDANSETRON HCL 4 MG/2ML IJ SOLN
INTRAMUSCULAR | Status: AC
  Filled 2023-12-08: qty 2

## 2023-12-08 MED ORDER — VISTASEAL 10 ML SINGLE DOSE KIT
PACK | CUTANEOUS | Status: DC | PRN
  Administered 2023-12-08: 10 mL via TOPICAL

## 2023-12-08 MED ORDER — OXYCODONE HCL 5 MG/5ML PO SOLN: 5.0000 mg | Freq: Once | ORAL | Status: AC | PRN

## 2023-12-08 MED ORDER — DEXAMETHASONE SODIUM PHOSPHATE 10 MG/ML IJ SOLN
INTRAMUSCULAR | Status: DC | PRN
  Administered 2023-12-08: 10 mg via INTRAVENOUS

## 2023-12-08 MED ORDER — CEFAZOLIN SODIUM-DEXTROSE 2-4 GM/100ML-% IV SOLN
INTRAVENOUS | Status: AC
  Filled 2023-12-08: qty 100

## 2023-12-08 MED ORDER — MEPERIDINE HCL 25 MG/ML IJ SOLN: 6.2500 mg | INTRAMUSCULAR | Status: DC | PRN

## 2023-12-08 MED ORDER — ROCURONIUM 10MG/ML (10ML) SYRINGE FOR MEDFUSION PUMP - OPTIME
INTRAVENOUS | Status: DC | PRN
  Administered 2023-12-08: 30 mg via INTRAVENOUS
  Administered 2023-12-08: 70 mg via INTRAVENOUS

## 2023-12-08 MED ORDER — FENTANYL CITRATE (PF) 100 MCG/2ML IJ SOLN
INTRAMUSCULAR | Status: DC | PRN
  Administered 2023-12-08 (×2): 50 ug via INTRAVENOUS

## 2023-12-08 MED ORDER — OXYCODONE HCL 5 MG PO TABS
ORAL_TABLET | ORAL | Status: AC
  Filled 2023-12-08: qty 1

## 2023-12-08 MED ORDER — PROPOFOL 10 MG/ML IV BOLUS
INTRAVENOUS | Status: AC
  Filled 2023-12-08: qty 20

## 2023-12-08 MED ORDER — LACTATED RINGERS IV SOLN: INTRAVENOUS | Status: DC

## 2023-12-08 MED ORDER — OXYCODONE HCL 5 MG PO TABS: 5.0000 mg | ORAL_TABLET | Freq: Four times a day (QID) | ORAL | 0 refills | Status: DC | PRN

## 2023-12-08 MED ORDER — PHENYLEPHRINE HCL-NACL 20-0.9 MG/250ML-% IV SOLN: INTRAVENOUS | Status: DC | PRN

## 2023-12-08 MED ORDER — DEXMEDETOMIDINE HCL IN NACL 80 MCG/20ML IV SOLN
INTRAVENOUS | Status: DC | PRN
Start: 2023-12-08 — End: 2023-12-08
  Administered 2023-12-08: 8 ug via INTRAVENOUS
  Administered 2023-12-08: 4 ug via INTRAVENOUS

## 2023-12-08 MED ORDER — ONDANSETRON HCL 4 MG/2ML IJ SOLN
INTRAMUSCULAR | Status: DC | PRN
  Administered 2023-12-08 (×2): 4 mg via INTRAVENOUS

## 2023-12-08 MED ORDER — LIDOCAINE 2% (20 MG/ML) 5 ML SYRINGE
INTRAMUSCULAR | Status: AC
  Filled 2023-12-08: qty 5

## 2023-12-08 SURGICAL SUPPLY — 53 items
APPLICATOR VISTASEAL 35 (MISCELLANEOUS) IMPLANT
BLADE CLIPPER SURG (BLADE) IMPLANT
BLADE SURG 15 STRL LF DISP TIS (BLADE) ×3 IMPLANT
CANISTER SUCT 1200ML W/VALVE (MISCELLANEOUS) IMPLANT
CHLORAPREP W/TINT 26 (MISCELLANEOUS) ×3 IMPLANT
CLIP APPLIE 5 13 M/L LIGAMAX5 (MISCELLANEOUS) IMPLANT
COVER BACK TABLE 60X90IN (DRAPES) ×3 IMPLANT
COVER MAYO STAND STRL (DRAPES) ×3 IMPLANT
DERMABOND ADVANCED .7 DNX12 (GAUZE/BANDAGES/DRESSINGS) ×3 IMPLANT
DEVICE SECURE STRAP 25 ABSORB (INSTRUMENTS) IMPLANT
DISSECTOR BALLN SPACEMKR + OVL (BALLOONS) ×3 IMPLANT
DISSECTOR BLUNT TIP ENDO 5MM (MISCELLANEOUS) IMPLANT
DRAPE LAPAROTOMY TRNSV 102X78 (DRAPES) ×3 IMPLANT
DRAPE UTILITY XL STRL (DRAPES) ×3 IMPLANT
ELECT COATED BLADE 2.86 ST (ELECTRODE) ×3 IMPLANT
ELECTRODE REM PT RTRN 9FT ADLT (ELECTROSURGICAL) ×3 IMPLANT
GAUZE 4X4 16PLY ~~LOC~~+RFID DBL (SPONGE) ×3 IMPLANT
GLOVE BIO SURGEON STRL SZ8 (GLOVE) ×3 IMPLANT
GLOVE BIOGEL PI IND STRL 7.0 (GLOVE) IMPLANT
GLOVE BIOGEL PI IND STRL 8 (GLOVE) ×3 IMPLANT
GLOVE ECLIPSE 8.0 STRL XLNG CF (GLOVE) ×3 IMPLANT
GLOVE SURG SS PI 7.5 STRL IVOR (GLOVE) IMPLANT
GOWN STRL REUS W/ TWL LRG LVL3 (GOWN DISPOSABLE) ×3 IMPLANT
GOWN STRL REUS W/ TWL XL LVL3 (GOWN DISPOSABLE) ×3 IMPLANT
GOWN STRL SURGICAL XL XLNG (GOWN DISPOSABLE) IMPLANT
IRRIGATION SUCT STRKRFLW 2 WTP (MISCELLANEOUS) IMPLANT
MESH OVITEX PERM 10X17 3L (Mesh General) IMPLANT
NDL HYPO 25X1 1.5 SAFETY (NEEDLE) ×3 IMPLANT
NEEDLE HYPO 25X1 1.5 SAFETY (NEEDLE) ×2 IMPLANT
NS IRRIG 1000ML POUR BTL (IV SOLUTION) ×3 IMPLANT
PACK BASIN DAY SURGERY FS (CUSTOM PROCEDURE TRAY) ×3 IMPLANT
PENCIL SMOKE EVACUATOR (MISCELLANEOUS) ×3 IMPLANT
SCISSORS LAP 5X35 DISP (ENDOMECHANICALS) IMPLANT
SET TUBE SMOKE EVAC HIGH FLOW (TUBING) ×3 IMPLANT
SLEEVE SCD COMPRESS KNEE MED (STOCKING) ×3 IMPLANT
SPIKE FLUID TRANSFER (MISCELLANEOUS) IMPLANT
SUT MNCRL AB 4-0 PS2 18 (SUTURE) ×3 IMPLANT
SUT NOVA 0 T19/GS 22DT (SUTURE) ×3 IMPLANT
SUT SILK 3 0 SH 30 (SUTURE) IMPLANT
SUT VIC AB 2-0 SH 27XBRD (SUTURE) ×3 IMPLANT
SUT VICRYL 0 UR6 27IN ABS (SUTURE) IMPLANT
SUT VICRYL 3-0 CR8 SH (SUTURE) ×3 IMPLANT
SYR CONTROL 10ML LL (SYRINGE) ×3 IMPLANT
SYR TOOMEY 50ML (SYRINGE) IMPLANT
TOWEL GREEN STERILE FF (TOWEL DISPOSABLE) ×6 IMPLANT
TRAY FOL W/BAG SLVR 16FR STRL (SET/KITS/TRAYS/PACK) IMPLANT
TRAY FOLEY W/BAG SLVR 14FR LF (SET/KITS/TRAYS/PACK) ×3 IMPLANT
TRAY LAPAROSCOPIC (CUSTOM PROCEDURE TRAY) ×3 IMPLANT
TROCAR BALLN 12MMX100 BLUNT (TROCAR) IMPLANT
TROCAR XCEL BLADELESS 5X75MML (TROCAR) IMPLANT
TROCAR Z-THREAD OPTICAL 5X100M (TROCAR) IMPLANT
TUBE CONNECTING 20X1/4 (TUBING) ×3 IMPLANT
YANKAUER SUCT BULB TIP NO VENT (SUCTIONS) ×3 IMPLANT

## 2023-12-08 NOTE — H&P (Signed)
 History of Present Illness: Wesley Schramm. is a 63 y.o. male who is seen today as an office consultation for evaluation of New Consultation  Patient presents for evaluation of left inguinal hernia. He states he had the hernia repaired over 40 years ago. He does not remember many details about it. He has developed some pain discomfort after lifting his left groin which is exacerbated with walking. He also has a umbilical hernia as well. Denies any right groin pain though. The pain in his left groin is made worse with activity. He also feels fullness and a bulge there. This pops in and pops out though.  Review of Systems: A complete review of systems was obtained from the patient. I have reviewed this information and discussed as appropriate with the patient. See HPI as well for other ROS.    Medical History: Past Medical History:  Diagnosis Date   Hyperlipidemia   There is no problem list on file for this patient.  History reviewed. No pertinent surgical history.   No Known Allergies  Current Outpatient Medications on File Prior to Visit  Medication Sig Dispense Refill   carbidopa -levodopa  (SINEMET ) 25-100 mg tablet Take by mouth   rOPINIRole  (REQUIP ) 2 MG tablet TAKE 1 TABLET (2 MG TOTAL) BY MOUTH 3 (THREE) TIMES DAILY. PLEASE NOTE, CHANGE IN PILL SIZE/DOSE.   rosuvastatin  (CRESTOR ) 40 MG tablet Take 40 mg by mouth once daily   simvastatin (ZOCOR) 40 MG tablet Take 40 mg by mouth once daily   No current facility-administered medications on file prior to visit.   History reviewed. No pertinent family history.   Social History   Tobacco Use  Smoking Status Every Day   Current packs/day: 1.00   Types: Cigarettes  Smokeless Tobacco Never    Social History   Socioeconomic History   Marital status: Married  Tobacco Use   Smoking status: Every Day  Current packs/day: 1.00  Types: Cigarettes   Smokeless tobacco: Never  Vaping Use   Vaping status: Never Used  Substance  and Sexual Activity   Alcohol use: Yes   Drug use: Defer   Sexual activity: Defer   Social Drivers of Health   Housing Stability: Unknown (07/13/2023)  Housing Stability Vital Sign   Homeless in the Last Year: No   Objective:   Vitals:  11/09/23 1509  BP: 113/78  Pulse: 99  Temp: 37.1 C (98.7 F)  SpO2: 98%  Weight: (!) 101.1 kg (222 lb 12.8 oz)  Height: 172.7 cm (5' 8)  PainSc: 0-No pain   Body mass index is 33.88 kg/m.  Physical Exam Exam conducted with a chaperone present.  Cardiovascular:  Pulses: Normal pulses.  Pulmonary:  Effort: Pulmonary effort is normal.  Abdominal:  Tenderness: There is no abdominal tenderness.  Hernia: A hernia is present. Hernia is present in the umbilical area and left inguinal area. There is no hernia in the right inguinal area.   Comments: Both hernias are reducible.  Musculoskeletal:  Cervical back: Normal range of motion.     Assessment and Plan:   Diagnoses and all orders for this visit:  Unilateral recurrent inguinal hernia without obstruction or gangrene  Umbilical hernia without obstruction and without gangrene   Patient has recurrent left inguinal hernia on examination today. He states he had an open repair over 40 years ago. I have no records unfortunately of this. I have discussed options of laparoscopic repair with open repair and the use of mesh in both. Also discussed options  of repairing his umbilical hernia as well. He will think over his options and let us  know.The risk of hernia repair include bleeding, Infection, Recurrence of the hernia, Mesh use, chronic pain, Organ injury, Bowel injury, Bladder injury, nerve injury with numbness around the incision, Death, and worsening of preexisting medical problems. The alternatives to surgery have been discussed as well.. Long term expectations of both operative and non operative treatments have been discussed. The patient agrees to proceed.    DEBBY CURTISTINE SHIPPER, MD

## 2023-12-08 NOTE — Op Note (Signed)
 Preoperative diagnosis: Recurrent left inguinal hernia reducible and umbilical hernia measuring 2 cm reducible initial  Postoperative diagnosis: Same  Procedure: Laparoscopic transabdominal repair with mesh of recurrent left inguinal hernia and primary closure of umbilical hernia  Surgeon: Debby Shipper, MD  Anesthesia: General With 0.25% Marcaine  with epinephrine   EBL: Minimal  Specimen: None  Indications for procedure: The patient is a 63 year old male who has a recurrent left inguinal hernia and an umbilical hernia.  He presents for laparoscopic repair after reviewing all of his options.  Risks and benefits of the procedure reviewed with him as well as long-term expectations, recurrence rates, and alternative surgical options.The risk of hernia repair include bleeding,  Infection,   Recurrence of the hernia,  Mesh use, chronic pain,  Organ injury,  Bowel injury,  Bladder injury,   nerve injury with numbness around the incision,  Death,  and worsening of preexisting  medical problems.  The alternatives to surgery have been discussed as well..  Long term expectations of both operative and non operative treatments have been discussed.   The patient agrees to proceed.   Description of procedure: The patient was met in the holding area and questions were answered.  He was taken back to the op room placed upon upon the OR table.  After induction of general esthesia Foley catheter was placed under sterile conditions and both arms were tucked.  The abdomen and lower Ringle regions were prepped and draped in a sterile fashion and timeout performed.  Proper patient, site and procedure verified.  A curvilinear incision was then made at the umbilicus.  Dissection was carried down and the umbilical hernia was opened.  This is about a 2 cm defect.  A pursestring suture of 0 Vicryl was placed around the send 12 mm balloontipped port was placed.  This was all placed under direct vision.  Pneumoperitoneum was  then created to 15 mmHg of CO2 pressure.  The patient was placed in Trendelenburg.  Laparoscope placed.  The recurrent left indirect hernia was noted.  There is no evidence of a right inguinal hernia.  The cautery was used to open the peritoneum overlying the left inguinal hernia.  This was taken all the way laterally.  Once this peritoneal reflection was mobilized was taken all the way down to the pubic symphysis.  I then mobilized around the recurrent hernia and then reduced the peritoneum out of the hernia which appeared to be a recurrent indirect hernia along the cord.  The cord structures were skeletonized circumferentially.  Small lipoma was reduced back into the preperitoneal space.ovitex 10 x 17 cm inguinal hernia laparoscopic patch was used.  This was placed and then tacked down to the pubic tubercle with secure strap.  For the tax reviews along the superior aspect of the mesh.  This was tucked down behind the iliopubic tract carefully around the cord structures to cover the hernia defect.  Vistaseal liquid adhesive was used with laparoscopic applicator to secure at the bottom edge of the mesh below the inguinal ligament.  This also covered the femoral canal.  No tacks were placed laterally and Vistaseal was used to help secure the mesh to the abdominal wall.  This cover the defect very nicely with good overlap and was tucked behind the pubic symphysis as well more medially.  The bladder was well decompressed.  Minimal dissection was carried out behind the bladder loop.  We then closed the peritoneum with a secure strap tacker.  There is no ongoing bleeding.  Four-quadrant laparoscopy was performed which showed no injury to the internal viscera.  I then allowed the port to be removed with no signs of port site bleeding.  Umbilical port was then closed with #1 Novafil sutures with a pursestring suture of 0 Vicryl after allowing the CO2 to escape and removing the trocars.  4-0 Monocryl was used to close the  skin in a subcuticular fashion.  Dermabond applied.  All counts found to be correct.  Dermabond applied.  100 cc wound was instilled into the Foley catheter to help assist patient with voiding and the catheter was removed carefully without any trauma.  All counts were found to be correct.  The patient was awoke expected taken to recovery in satisfactory condition.

## 2023-12-08 NOTE — Anesthesia Preprocedure Evaluation (Addendum)
 Anesthesia Evaluation  Patient identified by MRN, date of birth, ID band Patient awake    Reviewed: Allergy & Precautions, NPO status , Patient's Chart, lab work & pertinent test results  History of Anesthesia Complications Negative for: history of anesthetic complications  Airway Mallampati: II  TM Distance: >3 FB Neck ROM: Full    Dental  (+) Teeth Intact, Dental Advisory Given   Pulmonary Current Smoker and Patient abstained from smoking.   Pulmonary exam normal        Cardiovascular Normal cardiovascular exam  4/25  A pharmacological stress test was performed using IV Lexiscan  0.4mg  over 10 seconds performed without concurrent submaximal exercise. The patient reported no symptoms during the stress test. Hypertensive blood pressure and normal heart rate response noted during stress. Heart rate recovery was normal.   No ST deviation was noted. ECG was interpretable and without significant changes. The ECG was not diagnostic due to pharmacologic protocol.   LV perfusion is normal. There is no evidence of ischemia. There is no evidence of infarction.   Left ventricular function is normal. Nuclear stress EF: 64%. The left ventricular ejection fraction is normal (55-65%). End diastolic cavity size is normal. End systolic cavity size is normal. No evidence of transient ischemic dilation (TID) noted.   Prior study not available for comparison.   The study is normal. The study is low risk.      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 liver ROS,,,Anal condyloma   Endo/Other  negative endocrine ROS    Renal/GU negative Renal ROS  negative genitourinary   Musculoskeletal negative musculoskeletal ROS (+)    Abdominal   Peds  Hematology negative hematology ROS (+)   Anesthesia Other Findings   Reproductive/Obstetrics                              Anesthesia  Physical Anesthesia Plan  ASA: 3  Anesthesia Plan: General   Post-op Pain Management: Tylenol  PO (pre-op)*, Celebrex  PO (pre-op)* and Minimal or no pain anticipated   Induction: Intravenous  PONV Risk Score and Plan: 1 and Treatment may vary due to age or medical condition, Ondansetron  and Dexamethasone   Airway Management Planned: Oral ETT  Additional Equipment: None  Intra-op Plan:   Post-operative Plan:   Informed Consent: I have reviewed the patients History and Physical, chart, labs and discussed the procedure including the risks, benefits and alternatives for the proposed anesthesia with the patient or authorized representative who has indicated his/her understanding and acceptance.       Plan Discussed with: Anesthesiologist and CRNA  Anesthesia Plan Comments:          Anesthesia Quick Evaluation

## 2023-12-08 NOTE — Interval H&P Note (Signed)
 History and Physical Interval Note:  12/08/2023 11:04 AM  Wesley [name].  has presented today for surgery, with the diagnosis of LEFT INGUINAL HERNIA AND UMBILICAL HERNIA.  The various methods of treatment have been discussed with the patient and family. After consideration of risks, benefits and other options for treatment, the patient has consented to  Procedure(s) with comments: REPAIR, HERNIA, INGUINAL, LAPAROSCOPIC (Left) - left REPAIR, HERNIA, UMBILICAL, ADULT (N/A) - MESH as a surgical intervention.  The patient's history has been reviewed, patient examined, no change in status, stable for surgery.  I have reviewed the patient's chart and labs.  Questions were answered to the patient's satisfaction.     Jaquelyne Firkus A Allante Beane

## 2023-12-08 NOTE — Discharge Instructions (Addendum)
 [facility] _______[facility]  UMBILICAL OR INGUINAL HERNIA REPAIR: POST OP INSTRUCTIONS  Always review your discharge instruction sheet given to you by the facility where your surgery was performed. IF YOU HAVE DISABILITY OR FAMILY LEAVE FORMS, YOU MUST BRING THEM TO THE OFFICE FOR PROCESSING.   DO NOT GIVE THEM TO YOUR DOCTOR.  1. A  prescription for pain medication may be given to you upon discharge.  Take your pain medication as prescribed, if needed.  If narcotic pain medicine is not needed, then you may take acetaminophen  (Tylenol ) or ibuprofen  (Advil ) as needed. 2. Take your usually prescribed medications unless otherwise directed. If you need a refill on your pain medication, please contact your pharmacy.  They will contact our office to request authorization. Prescriptions will not be filled after 5 pm or on week-ends. 3. You should follow a light diet the first 24 hours after arrival home, such as soup and crackers, etc.  Be sure to include lots of fluids daily.  Resume your normal diet the day after surgery. 4.Most patients will experience some swelling and bruising around the umbilicus or in the groin and scrotum.  Ice packs and reclining will help.  Swelling and bruising can take several days to resolve.  6. It is common to experience some constipation if taking pain medication after surgery.  Increasing fluid intake and taking a stool softener (such as Colace) will usually help or prevent this problem from occurring.  A mild laxative (Milk of Magnesia or Miralax) should be taken according to package directions if there are no bowel movements after 48 hours. 7. Unless discharge instructions indicate otherwise, you may remove your bandages 24-48 hours after surgery, and you may shower at that time.  You may have steri-strips (small skin tapes) in place directly over the incision.  These strips should be left on the skin for 7-10 days.  If your surgeon used skin glue on the  incision, you may shower in 24 hours.  The glue will flake off over the next 2-3 weeks.  Any sutures or staples will be removed at the office during your follow-up visit. 8. ACTIVITIES:  You may resume regular (light) daily activities beginning the next day--such as daily self-care, walking, climbing stairs--gradually increasing activities as tolerated.  You may have sexual intercourse when it is comfortable.  Refrain from any heavy lifting or straining until approved by your doctor.  a.You may drive when you are no longer taking prescription pain medication, you can comfortably wear a seatbelt, and you can safely maneuver your car and apply brakes. b.RETURN TO WORK:   _____________________________________________  9.You should see your doctor in the office for a follow-up appointment approximately 2-3 weeks after your surgery.  Make sure that you call for this appointment within a day or two after you arrive home to insure a convenient appointment time. 10.OTHER INSTRUCTIONS: _________________________    _____________________________________  WHEN TO CALL YOUR DOCTOR: Fever over 101.0 Inability to urinate Nausea and/or vomiting Extreme swelling or bruising Continued bleeding from incision. Increased pain, redness, or drainage from the incision  The clinic staff is available to answer your questions during regular business hours.  Please don't hesitate to call and ask to speak to one of the nurses for clinical concerns.  If you have a medical emergency, go to the nearest emergency room or call 911.  A surgeon from [facility] is always on call at the hospital   [address],  [place], [place]  [address] ?  [address] [address], [place], [place]   [address] [phone] ? [phone] ? FAX [phone] Web site: [email or web address]   No Tylenol  until 9pm today, if needed.

## 2023-12-08 NOTE — Anesthesia Procedure Notes (Addendum)
 Procedure Name: Intubation Date/Time: 12/08/2023 12:45 PM  Performed by: Denton Niels CROME, CRNAPre-anesthesia Checklist: Patient identified, Emergency Drugs available, Suction available and Patient being monitored Patient Re-evaluated:Patient Re-evaluated prior to induction Oxygen Delivery Method: Circle system utilized Preoxygenation: Pre-oxygenation with 100% oxygen Induction Type: IV induction Ventilation: Mask ventilation without difficulty Laryngoscope Size: Mac and 3 Tube type: Oral Tube size: 7.0 mm Number of attempts: 1 Airway Equipment and Method: Stylet Placement Confirmation: ETT inserted through vocal cords under direct vision, positive ETCO2 and breath sounds checked- equal and bilateral Secured at: 22 cm Tube secured with: Tape Dental Injury: Teeth and Oropharynx as per pre-operative assessment

## 2023-12-08 NOTE — Transfer of Care (Signed)
 Immediate Anesthesia Transfer of Care Note  Patient: Wesley [name].  Procedure(s) Performed: REPAIR, HERNIA, INGUINAL, LAPAROSCOPIC (Left) REPAIR, HERNIA, UMBILICAL, ADULT  Patient Location: PACU  Anesthesia Type:General  Level of Consciousness: awake and patient cooperative  Airway & Oxygen Therapy: Patient Spontanous Breathing and Patient connected to face mask oxygen  Post-op Assessment: Report given to RN and Post -op Vital signs reviewed and stable  Post vital signs: Reviewed and stable  Last Vitals:  Vitals Value Taken Time  BP 157/79 12/08/23 15:22  Temp    Pulse 101 12/08/23 15:28  Resp 15 12/08/23 15:28  SpO2 98 % 12/08/23 15:28  Vitals shown include unfiled device data.  Last Pain:  Vitals:   12/08/23 1116  TempSrc: Temporal  PainSc: 0-No pain      Patients Stated Pain Goal: 4 (12/08/23 1116)  Complications: No notable events documented.

## 2023-12-09 ENCOUNTER — Encounter (HOSPITAL_BASED_OUTPATIENT_CLINIC_OR_DEPARTMENT_OTHER): Payer: Self-pay | Admitting: Surgery

## 2023-12-10 NOTE — Anesthesia Postprocedure Evaluation (Signed)
 Anesthesia Post Note  Patient: Wesley [name].  Procedure(s) Performed: LAPAROSCOPIC INGUINAL HERNIA REPAIR, LEFT (Left: Inguinal) UMBILICAL HERNIA REPAIR, ADULT (Abdomen)     Patient location during evaluation: PACU Anesthesia Type: General Level of consciousness: awake and alert Pain management: pain level controlled Vital Signs Assessment: post-procedure vital signs reviewed and stable Respiratory status: spontaneous breathing, nonlabored ventilation, respiratory function stable and patient connected to nasal cannula oxygen Cardiovascular status: blood pressure returned to baseline and stable Postop Assessment: no apparent nausea or vomiting Anesthetic complications: no   No notable events documented.  Last Vitals:  Vitals:   12/08/23 1615 12/08/23 1626  BP: (!) 175/70 (!) 136/54  Pulse: 85 84  Resp: (!) 21 18  Temp:  (!) 36.4 C  SpO2: 95% 94%    Last Pain:  Vitals:   12/08/23 1626  TempSrc:   PainSc: 5                  Edger Husain L Denman Pichardo

## 2023-12-14 NOTE — Patient Instructions (Signed)

## 2023-12-14 NOTE — Progress Notes (Unsigned)
 No chief complaint on file.   HISTORY OF PRESENT ILLNESS:  12/14/23 ALL:  Wesley Wesley [name] returns for follow up for PD. He was last seen by me 08/2023 and reporting worsening tremor and imbalance. He was advised to increase dose of Sinemet  to prescribed dose of 1.5 tablets QID and continue ropinirole  2mg  daily at bedtime. MRI and NCS ordered previously by Dr Buck for neck pain and he was advised to schedule. Referral was placed to academic center per his request for second opinion.     He has an appt with [facility] 02/2024 and one with [facility] 06/2024.   08/14/2023 ALL:  Wesley [name] Wesley [name]. is a 63 y.o. male here today for follow up for PD. He was seen last week by Dr Buck for neck pain. He reported worsening tremor and difficulty with balance. Also noted RLS symptoms were more bothersome. MRI and EMG ordered and he was advised to return, today, to discuss increasing carb-levo dose.   Today, he reports continued difficulty with worsening tremor, imbalance and restless legs. He continues carb-levo 1 tablet QID (7a, 11a, 4p, 11p) and ropinirole  2mg  TID. Carb-levo prescribed as 1.5 tablet QID. He is tolerating meds well. Wife reports new mouth tremor. She has noted mostly when he is driving. He also seems to have more shuffling when walking. Fortunately no falls. No assistive devices. He walks 3-4 miles daily for exercise.   He continues to have chronic constipation. Usually has bowel movement every 2-3 days. He recently saw PCP for hemorrhoid. He has added fiber to coffee. He usually drinks 3-4 bottles of water daily. He takes Miralax as needed.   He notes more difficulty with short term memory loss. He has a hard time retaining new information. 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 He is sleeping fairly well. He feels RLS is usually well managed but there are some nights where he  feels he is more restless. HST 03/2023 did not show concerns for sleep breathing disorder. He drinks 1-2 glasses of wine about 3-4 times a week.   He is able to perform ADLs independently. He manages his own medicaitons and finances. He drives without difficulty with exception of night time. He has a hard time seeing at night.      HISTORY (copied from Dr Obie previous note)  Wesley. [name] is a 63 year old right-handed gentleman with an underlying medical history of hyperlipidemia, Parkinson's disease, reflux disease, left hand pain and obesity, who presents for a new problem visit of neck pain.  He is referred by his primary care physician, Dr. Okey.  He is accompanied by his wife today.  He was last seen in this clinic for his Parkinson's disease in December 2020 for, at which time He saw Dr. Okey on 04/20/2023 and reported pain in his neck and right arm as well as shoulder pain.  He was treated with a prednisone taper.  He has not had any cervical imaging test done.  He is scheduled to see Greig Forbes, NP next week for his PD follow-up.   Today, 08/05/2023: He reports a several month history of right-sided neck pain.  It radiates to the shoulder.  Several months ago, he saw orthopedics for his right shoulder and had an x-ray and reports that there was no significant arthritis in the shoulder but he has not had any neck imaging.  He feels that the pain is probably a little bit better since completing the steroid treatment.  He took a 2-week course back in mid February.  His wife provides additional history.  She is worried about his balance.  He has had worsening tremor.  He continues to take Sinemet  1-1/2 pills 4 times a day and takes ropinirole .  He has some additional restless leg symptoms.  He feels that his balance is worse and his tremor is worse per his report as well.  He has had worsening constipation, takes MiraLAX as needed, not daily.  Had an issue with hemorrhoid a few weeks back.  He has not seen  GI.  Pain in the neck does not radiate to the arm or hand, he does not feel any weakness in the right hand but has noticed a tremor on the right side as well.   REVIEW OF SYSTEMS: Out of a complete 14 system review of symptoms, the patient complains only of the following symptoms, see HPI and all other reviewed systems are negative.   ALLERGIES: No Known Allergies   HOME MEDICATIONS: Outpatient Medications Prior to Visit  Medication Sig Dispense Refill   acetaminophen  (TYLENOL ) 325 MG tablet Take 650 mg by mouth every 6 (six) hours as needed.     carbidopa -levodopa  (SINEMET  IR) 25-100 MG tablet Take 1.5 tablets by mouth 4 (four) times daily. 540 tablet 3   ezetimibe  (ZETIA ) 10 MG tablet Take 1 tablet (10 mg total) by mouth daily. 90 tablet 3   ibuprofen  (ADVIL ) 800 MG tablet Take 1 tablet (800 mg total) by mouth every 8 (eight) hours as needed. 30 tablet 0   oxyCODONE  (OXY IR/ROXICODONE ) 5 MG immediate release tablet Take 1 tablet (5 mg total) by mouth every 6 (six) hours as needed for severe pain (pain score 7-10). 15 tablet 0   rOPINIRole  (REQUIP ) 2 MG tablet Take 1 tablet (2 mg total) by mouth 3 (three) times daily. 270 tablet 3   rosuvastatin  (CRESTOR ) 40 MG tablet Take 1 tablet (40 mg total) by mouth daily. 90 tablet 3   tamsulosin  (FLOMAX ) 0.4 MG CAPS capsule Take 1 capsule (0.4 mg total) by mouth daily. 5 capsule 0   No facility-administered medications prior to visit.     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 Cornett, MD;  Location: [facility];  Service: General;  Laterality: N/A;    EXAMINATION UNDER ANESTHESIA N/A 09/30/2012   Procedure: EXAM UNDER ANESTHESIA;  Surgeon: Bernarda Debby, MD;  Location: [facility];  Service: General;  Laterality: N/A;   HIGH RESOLUTION ANOSCOPY N/A 09/30/2012   Procedure: HIGH RESOLUTION ANOSCOPY;  Surgeon: Bernarda Debby, MD;  Location: [facility] ;  Service: General;  Laterality: N/A;   INGUINAL HERNIA REPAIR  1980'S   INGUINAL HERNIA REPAIR Left 12/08/2023   Procedure: LAPAROSCOPIC INGUINAL HERNIA REPAIR, LEFT;  Surgeon: Vanderbilt Debby, MD;  Location: [facility];  Service: General;  Laterality: Left;  left   LASER ABLATION CONDOLAMATA N/A 09/30/2012   Procedure: LASER ABLATION CONDOLAMATA;  Surgeon: Bernarda Debby, MD;  Location: [facility];  Service: General;  Laterality: N/A;   LASER ABLATION CONDOLAMATA N/A 02/02/2020   Procedure: LASER ABLATION CONDYLOMA;  Surgeon: Debby Hila, MD;  Location: [facility];  Service: General;  Laterality: N/A;   RECTAL EXAM UNDER ANESTHESIA N/A 02/02/2020   Procedure: RECTAL EXAM UNDER ANESTHESIA;  Surgeon: Debby Hila, MD;  Location: [facility];  Service: General;  Laterality: N/A;   UMBILICAL HERNIA REPAIR N/A 12/08/2023   Procedure: UMBILICAL HERNIA REPAIR, ADULT;  Surgeon: Vanderbilt Debby, MD;  Location: [facility];  Service: General;  Laterality: N/A;  MESH   WART FULGURATION  07/16/2011   Procedure: FULGURATION ANAL WART;  Surgeon: Debby LABOR. Cornett, MD;  Location: [facility];  Service: General;  Laterality: N/A;     FAMILY HISTORY: Family History  Problem Relation Age of Onset   Alzheimer's disease Mother    Heart disease Father        CABG late 110s.   Parkinson's disease Neg Hx      SOCIAL HISTORY: Social History   Socioeconomic History   Marital status: Married    Spouse name: Not on file   Number of children: Not on file   Years of education: Not on file    Highest education level: Not on file  Occupational History   Not on file  Tobacco Use   Smoking status: Every Day    Current packs/day: 0.50    Average packs/day: 0.5 packs/day for 35.0 years (17.5 ttl pk-yrs)    Types: Cigarettes   Smokeless tobacco: Never   Tobacco comments:    Patient states trying to cut out smoking. He states he already has information on quitting.  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     Right handed   Caffeine: 1 cup coffee/day   Social Drivers of Corporate investment banker Strain: Not on file  Food Insecurity: Not on file  Transportation Needs: Not on file  Physical Activity: Not on file  Stress: Not on file  Social Connections: Not on file  Intimate Partner Violence: Not on file     PHYSICAL EXAM  There were no vitals filed for this visit.  There is no height or weight on file to calculate BMI.  Generalized: Well developed, in no acute distress  Cardiology: normal rate and rhythm, no murmur auscultated  Respiratory: clear to auscultation bilaterally    Neurological examination  Mentation: Alert oriented to time, place, history taking. Follows all commands speech and language fluent Cranial nerve II-XII: Pupils were equal round reactive to light. Extraocular movements were full, visual field were full on confrontational test. Facial sensation and strength were normal. Uvula tongue midline. Head turning and shoulder shrug  were normal and symmetric. Motor: The motor testing reveals 5 over 5 strength of all 4 extremities. Good symmetric motor tone is noted throughout. No tremor noted with visit, today.  Sensory: Sensory testing  is intact to soft touch on all 4 extremities. No evidence of extinction is noted.  Coordination: Cerebellar testing reveals good finger-nose-finger and heel-to-shin bilaterally.  Gait and  station: Gait is normal.  Reflexes: Deep tendon reflexes are symmetric and normal bilaterally.    DIAGNOSTIC DATA (LABS, IMAGING, TESTING) - I reviewed patient records, labs, notes, testing and imaging myself where available.  Lab Results  Component Value Date   WBC 16.8 (H) 11/27/2015   HGB 15.3 11/27/2015   HCT 45.0 11/27/2015   MCV 92.1 11/27/2015   PLT 321 11/27/2015      Component Value Date/Time   NA 139 11/27/2015 2049   K 3.6 11/27/2015 2049   CL 108 11/27/2015 2049   CO2 24 07/15/2011 0950   GLUCOSE 106 (H) 11/27/2015 2049   BUN 15 11/27/2015 2049   CREATININE 1.00 11/27/2015 2049   CALCIUM  9.3 07/15/2011 0950   PROT 7.3 07/15/2011 0950   ALBUMIN 4.0 07/15/2011 0950   AST 30 07/15/2011 0950   ALT 36 07/15/2011 0950   ALKPHOS 97 07/15/2011 0950   BILITOT 0.8 07/15/2011 0950   GFRNONAA >90 07/15/2011 0950   GFRAA >90 07/15/2011 0950   Lab Results  Component Value Date   CHOL 159 09/14/2023   HDL 45 09/14/2023   LDLCALC 80 09/14/2023   TRIG 204 (H) 09/14/2023   CHOLHDL 3.5 09/14/2023   No results found for: HGBA1C No results found for: VITAMINB12      08/14/2023    8:45 AM  MMSE - Mini Mental State Exam  Orientation to time 5  Orientation to Place 5  Registration 3  Attention/ Calculation 4  Recall 3  Language- name 2 objects 2  Language- repeat 1  Language- follow 3 step command 3  Language- read & follow direction 1  Write a sentence 1  Copy design 1  Total score 29         No data to display           ASSESSMENT AND PLAN  63 y.o. 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 here with    Parkinson's disease without  dyskinesia, with fluctuating manifestations (HCC)  Cervical radiculopathy  Restless leg  Memory loss  Chronic constipation  Wesley MALVA Nola Mickey. Notes worsening hand tremor, new mouth tremor, intermittent difficulty with RLS and short term memory loss/difficulty with retention. He has only taken carb-levo 1 tablet four times daily. Prescribed as 1.5 tablets QID. I have advised adding 1/2 tablet every week until taking 1.5 tablets QID. Continue ropinirole  2mg  daily at bedtime. MMSE 29/30. 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 Formal neurocognitive testing referral offered but declined for now. He does wish to be referred to [facility] for second opinion and consideration of potential research trial participation. Referral placed. We discussed ned for increased water intake and alcohol us  in moderation. Consider adding Colace to Miralax for chronic constipation. Continue regular exercise. Discussed memory compensation strategies and benefits of MIND diet. He will follow up with PCP as directed. He will return to see me in 4 months. He verbalizes understanding and agreement with this plan.   No orders of the defined types were placed in this encounter.    No orders of the defined types were placed in this encounter.    Greig Forbes, MSN, FNP-C 12/14/2023, 4:28 PM  [facility] [address] [place], [place] [address] [phone]

## 2023-12-15 ENCOUNTER — Ambulatory Visit: Admitting: Family Medicine

## 2023-12-15 ENCOUNTER — Encounter: Payer: Self-pay | Admitting: Family Medicine

## 2023-12-15 VITALS — BP 138/84 | HR 62 | Ht 69.0 in | Wt 222.5 lb

## 2023-12-15 DIAGNOSIS — R413 Other amnesia: Secondary | ICD-10-CM

## 2023-12-15 DIAGNOSIS — G20A2 Parkinson's disease without dyskinesia, with fluctuations: Secondary | ICD-10-CM | POA: Diagnosis not present

## 2023-12-15 DIAGNOSIS — M5412 Radiculopathy, cervical region: Secondary | ICD-10-CM | POA: Diagnosis not present

## 2023-12-15 DIAGNOSIS — G2581 Restless legs syndrome: Secondary | ICD-10-CM | POA: Diagnosis not present

## 2023-12-15 DIAGNOSIS — K5909 Other constipation: Secondary | ICD-10-CM

## 2023-12-31 LAB — LIPID PANEL
Chol/HDL Ratio: 2.4 ratio (ref 0.0–5.0)
Cholesterol, Total: 109 mg/dL (ref 100–199)
HDL: 46 mg/dL (ref >39)
LDL Chol Calc (NIH): 43 mg/dL (ref 0–99)
Triglycerides: 107 mg/dL (ref 0–149)
VLDL Cholesterol Cal: 20 mg/dL (ref 5–40)

## 2024-01-20 DIAGNOSIS — Z0000 Encounter for general adult medical examination without abnormal findings: Secondary | ICD-10-CM | POA: Diagnosis not present

## 2024-01-20 DIAGNOSIS — E559 Vitamin D deficiency, unspecified: Secondary | ICD-10-CM | POA: Diagnosis not present

## 2024-01-27 DIAGNOSIS — Z79899 Other long term (current) drug therapy: Secondary | ICD-10-CM | POA: Diagnosis not present

## 2024-01-27 DIAGNOSIS — E559 Vitamin D deficiency, unspecified: Secondary | ICD-10-CM | POA: Diagnosis not present

## 2024-01-27 DIAGNOSIS — Z0000 Encounter for general adult medical examination without abnormal findings: Secondary | ICD-10-CM | POA: Diagnosis not present

## 2024-01-27 DIAGNOSIS — E782 Mixed hyperlipidemia: Secondary | ICD-10-CM | POA: Diagnosis not present

## 2024-01-27 DIAGNOSIS — R9389 Abnormal findings on diagnostic imaging of other specified body structures: Secondary | ICD-10-CM | POA: Diagnosis not present

## 2024-01-27 DIAGNOSIS — G20A1 Parkinson's disease without dyskinesia, without mention of fluctuations: Secondary | ICD-10-CM | POA: Diagnosis not present

## 2024-01-27 DIAGNOSIS — J439 Emphysema, unspecified: Secondary | ICD-10-CM | POA: Diagnosis not present

## 2024-01-27 DIAGNOSIS — F17200 Nicotine dependence, unspecified, uncomplicated: Secondary | ICD-10-CM | POA: Diagnosis not present

## 2024-02-02 ENCOUNTER — Encounter (INDEPENDENT_AMBULATORY_CARE_PROVIDER_SITE_OTHER): Payer: Self-pay

## 2024-02-05 DIAGNOSIS — R413 Other amnesia: Secondary | ICD-10-CM | POA: Diagnosis not present

## 2024-02-05 DIAGNOSIS — G20C Parkinsonism, unspecified: Secondary | ICD-10-CM | POA: Diagnosis not present

## 2024-02-05 DIAGNOSIS — G2581 Restless legs syndrome: Secondary | ICD-10-CM | POA: Diagnosis not present

## 2024-02-05 DIAGNOSIS — G249 Dystonia, unspecified: Secondary | ICD-10-CM | POA: Diagnosis not present

## 2024-06-14 ENCOUNTER — Ambulatory Visit: Admitting: Neurology
